# Patient Record
Sex: Female | Born: 1980 | Race: White | Hispanic: No | Marital: Married | Smoking: Never smoker
Health system: Southern US, Community
[De-identification: ages and names within clinical notes are randomized; demographics above are authoritative.]

## PROBLEM LIST (undated history)

## (undated) DIAGNOSIS — F419 Anxiety disorder, unspecified: Secondary | ICD-10-CM

---

## 2009-09-08 ENCOUNTER — Inpatient Hospital Stay: Payer: Self-pay

## 2015-11-27 ENCOUNTER — Encounter: Payer: Self-pay | Admitting: Emergency Medicine

## 2015-11-27 ENCOUNTER — Emergency Department
Admission: EM | Admit: 2015-11-27 | Discharge: 2015-11-27 | Disposition: A | Discharge status: Home / Self Care | Payer: Medicaid Other | Attending: Emergency Medicine | Admitting: Emergency Medicine

## 2015-11-27 DIAGNOSIS — J0141 Acute recurrent pansinusitis: Secondary | ICD-10-CM | POA: Insufficient documentation

## 2015-11-27 DIAGNOSIS — R51 Headache: Secondary | ICD-10-CM | POA: Diagnosis present

## 2015-11-27 MED ORDER — AZITHROMYCIN 250 MG PO TABS: ORAL_TABLET | ORAL | Status: DC

## 2015-11-27 NOTE — ED Provider Notes (Signed)
[facility] Emergency Department Provider Note  ____________________________________________  Time seen: Approximately 10:00 AM  I have reviewed the triage vital signs and the nursing notes.   HISTORY  Chief Complaint Facial Pain   HPI Lynn Flynn is a 35 y.o. female is here with complaint of sinus pressure and nasal congestion per week. Patient states she's been taking over-the-counter medication without any relief. Patient has a history of sinus infections. She denies any fever or chills. She states she has a headache mostly behind each eye but no visual changes and no nausea or vomiting. She is unaware of any fever. She continues to have some nasal congestion and drainage for which she has taken over-the-counter medication. Currently she rates her pain is 3 out of 10.She has never been a smoker.  History reviewed. No pertinent past medical history.  There are no active problems to display for this patient.   History reviewed. No pertinent past surgical history.  Current Outpatient Rx  Name  Route  Sig  Dispense  Refill  . azithromycin (ZITHROMAX Z-PAK) 250 MG tablet      Take 2 tablets (500 mg) on  Day 1,  followed by 1 tablet (250 mg) once daily on Days 2 through 5.   6 each   0     Allergies Review of patient's allergies indicates no known allergies.  No family history on file.  Social History Social History  Substance Use Topics  . Smoking status: Never Smoker   . Smokeless tobacco: None  . Alcohol Use: No    Review of Systems Constitutional: No fever/chills Eyes: No visual changes. ENT: No sore throat. Positive sinus pressure. Positive nasal congestion Cardiovascular: Denies chest pain. Respiratory: Denies shortness of breath. Occasional cough Gastrointestinal:   No nausea, no vomiting.   Musculoskeletal: Negative for back pain. Skin: Negative for rash. Neurological: Negative for headaches, focal weakness or  numbness.  10-point ROS otherwise negative.  ____________________________________________   PHYSICAL EXAM:  VITAL SIGNS: ED Triage Vitals  Enc Vitals Group     BP 11/27/15 0837 143/77 mmHg     Pulse Rate 11/27/15 0837 97     Resp 11/27/15 0837 18     Temp 11/27/15 0837 98.2 F (36.8 C)     Temp Source 11/27/15 0837 Oral     SpO2 11/27/15 0837 100 %     Weight 11/27/15 0837 155 lb (70.308 kg)     Height 11/27/15 0837  (1.575 m)     Head Cir --      Peak Flow --      Pain Score 11/27/15 0900 3     Pain Loc --      Pain Edu? --      Excl. in GC? --     Constitutional: Alert and oriented. Well appearing and in no acute distress. Eyes: Conjunctivae are normal. PERRL. EOMI. Head: Atraumatic. Nose: Mild congestion/no rhinnorhea.  TMs are dull bilaterally. There is tenderness on percussion of the frontal and maxillary sinuses. Nasal mucosa is slightly reddish pink. Mouth/Throat: Mucous membranes are moist.  Oropharynx non-erythematous. Neck: No stridor.   Hematological/Lymphatic/Immunilogical: No cervical lymphadenopathy. Cardiovascular: Normal rate, regular rhythm. Grossly normal heart sounds.  Good peripheral circulation. Respiratory: Normal respiratory effort.  No retractions. Lungs CTAB. Musculoskeletal: No lower extremity tenderness nor edema.  No joint effusions. Neurologic:  Normal speech and language. No gross focal neurologic deficits are appreciated. No gait instability. Skin:  Skin is warm, dry and intact. No  rash noted. Psychiatric: Mood and affect are normal. Speech and behavior are normal.  ____________________________________________   LABS (all labs ordered are listed, but only abnormal results are displayed)  Labs Reviewed - No data to display   PROCEDURES  Procedure(s) performed: None  Critical Care performed: No  ____________________________________________   INITIAL IMPRESSION / ASSESSMENT AND PLAN / ED COURSE  Pertinent labs & imaging  results that were available during my care of the patient were reviewed by me and considered in my medical decision making (see chart for details).  She is given a Z-Pak to begin taking today. She can continue taking her over-the-counter medication for congestion and Tylenol as needed for headache. He'll follow up with her PCP if any continued problems. ____________________________________________   FINAL CLINICAL IMPRESSION(S) / ED DIAGNOSES  Final diagnoses:  Acute recurrent pansinusitis      Tommi Rumps, PA-C 11/27/15 1226  Emily Filbert, MD 11/27/15 [phone]

## 2015-11-27 NOTE — ED Notes (Signed)
States she developed some sinus pressure and nasal congestion couple of days ago

## 2015-11-27 NOTE — Discharge Instructions (Signed)
Sinusitis, Adult Sinusitis is redness, soreness, and puffiness (inflammation) of the air pockets in the bones of your face (sinuses). The redness, soreness, and puffiness can cause air and mucus to get trapped in your sinuses. This can allow germs to grow and cause an infection.  HOME CARE   Drink enough fluids to keep your pee (urine) clear or pale yellow.  Use a humidifier in your home.  Run a hot shower to create steam in the bathroom. Sit in the bathroom with the door closed. Breathe in the steam 3-4 times a day.  Put a warm, moist washcloth on your face 3-4 times a day, or as told by your doctor.  Use salt water sprays (saline sprays) to wet the thick fluid in your nose. This can help the sinuses drain.  Only take medicine as told by your doctor. GET HELP RIGHT AWAY IF:   Your pain gets worse.  You have very bad headaches.  You are sick to your stomach (nauseous).  You throw up (vomit).  You are very sleepy (drowsy) all the time.  Your face is puffy (swollen).  Your vision changes.  You have a stiff neck.  You have trouble breathing. MAKE SURE YOU:   Understand these instructions.  Will watch your condition.  Will get help right away if you are not doing well or get worse.   This information is not intended to replace advice given to you by your health care provider. Make sure you discuss any questions you have with your health care provider.   Document Released: 03/23/2008 Document Revised: 10/26/2014 Document Reviewed: 05/10/2012 Elsevier Interactive Patient Education 2016 [employer].    Follow-up with [facility] if any continued problems. Begin medication today. Increase fluids. Tylenol or ibuprofen as needed for sinus pain.

## 2016-07-22 ENCOUNTER — Encounter: Payer: Self-pay | Admitting: Emergency Medicine

## 2016-07-22 ENCOUNTER — Emergency Department
Admission: EM | Admit: 2016-07-22 | Discharge: 2016-07-22 | Disposition: A | Discharge status: Home / Self Care | Payer: Medicaid Other | Attending: Emergency Medicine | Admitting: Emergency Medicine

## 2016-07-22 DIAGNOSIS — J069 Acute upper respiratory infection, unspecified: Secondary | ICD-10-CM

## 2016-07-22 DIAGNOSIS — R05 Cough: Secondary | ICD-10-CM | POA: Diagnosis present

## 2016-07-22 MED ORDER — CHLORPHENIRAMINE MALEATE 4 MG PO TABS: 4.0000 mg | ORAL_TABLET | Freq: Two times a day (BID) | ORAL | 0 refills | Status: DC | PRN

## 2016-07-22 MED ORDER — GUAIFENESIN-CODEINE 100-10 MG/5ML PO SOLN: 10.0000 mL | ORAL | 0 refills | Status: DC | PRN

## 2016-07-22 MED ORDER — AZITHROMYCIN 250 MG PO TABS: ORAL_TABLET | ORAL | 0 refills | Status: DC

## 2016-07-22 NOTE — ED Provider Notes (Signed)
[facility] Emergency Department Provider Note   ____________________________________________   None    (approximate)  I have reviewed the triage vital signs and the nursing notes.   HISTORY  Chief Complaint Cough    HPI Lynn Flynn is a 35 y.o. female presents for a one-week history of cough congestion drainage and sinus pressure. Patient states that she's not been really running a fever at all. Has tried over-the-counter medications with no relief. Take only 12.   History reviewed. No pertinent past medical history.  There are no active problems to display for this patient.   History reviewed. No pertinent surgical history.  Prior to Admission medications   Medication Sig Start Date End Date Taking? Authorizing Provider  azithromycin (ZITHROMAX Z-PAK) 250 MG tablet Take 2 tablets (500 mg) on  Day 1,  followed by 1 tablet (250 mg) once daily on Days 2 through 5. 07/22/16   Evangeline Dakin, PA-C  chlorpheniramine (CHLOR-TRIMETON) 4 MG tablet Take 1 tablet (4 mg total) by mouth 2 (two) times daily as needed for allergies or rhinitis. 07/22/16   Charmayne Sheer Floreen Teegarden, PA-C  guaiFENesin-codeine 100-10 MG/5ML syrup Take 10 mLs by mouth every 4 (four) hours as needed for cough. 07/22/16   Evangeline Dakin, PA-C    Allergies Review of patient's allergies indicates no known allergies.  History reviewed. No pertinent family history.  Social History Social History  Substance Use Topics  . Smoking status: Never Smoker  . Smokeless tobacco: Not on file  . Alcohol use No    Review of Systems Constitutional: No fever/chills Eyes: No visual changes. [id number] sore throat secondary to drainage. Cardiovascular: Denies chest pain. Respiratory: Denies shortness of breath. Positive for cough productive yellow sputum Gastrointestinal: No abdominal pain.  No nausea, no vomiting.  No diarrhea.  No constipation. Genitourinary: Negative for  dysuria. Musculoskeletal: Negative for back pain. Skin: Negative for rash. Neurological: Negative for headaches, focal weakness or numbness.  10-point ROS otherwise negative.  ____________________________________________   PHYSICAL EXAM:  VITAL SIGNS: ED Triage Vitals [07/22/16 1104]  Enc Vitals Group     BP 119/84     Pulse Rate 93     Resp 18     Temp 99.7 F (37.6 C)     Temp Source Oral     SpO2 97 %     Weight 156 lb (70.8 kg)     Height 5\' 2"  (1.575 m)     Head Circumference      Peak Flow      Pain Score      Pain Loc      Pain Edu?      Excl. in GC?     Constitutional: Alert and oriented. Well appearing and in no acute distress. Eyes: Conjunctivae are normal. PERRL. EOMI. Head: Atraumatic. Nose: Positive congestion/rhinnorhea. Mouth/Throat: Mucous membranes are moist.  Oropharynx non-erythematous. Neck: No stridor.  Supple, full range of motion, no cervical tenderness. No cervical adenopathy noted. Cardiovascular: Normal rate, regular rhythm. Grossly normal heart sounds.  Good peripheral circulation. Respiratory: Normal respiratory effort.  No retractions. Lungs CTAB. Musculoskeletal: No lower extremity tenderness nor edema.  No joint effusions. Neurologic:  Normal speech and language. No gross focal neurologic deficits are appreciated. No gait instability. Skin:  Skin is warm, dry and intact. No rash noted. Psychiatric: Mood and affect are normal. Speech and behavior are normal.  ____________________________________________   LABS (all labs ordered are listed, but only abnormal results are displayed)  Labs Reviewed - No data to display ____________________________________________  EKG   ____________________________________________  RADIOLOGY   ____________________________________________   PROCEDURES  Procedure(s) performed: None  Procedures  Critical Care performed: No  ____________________________________________   INITIAL  IMPRESSION / ASSESSMENT AND PLAN / ED COURSE  Pertinent labs & imaging results that were available during my care of the patient were reviewed by me and considered in my medical decision making (see chart for details).  Acute upper respiratory infection. Rx given for Zithromax, Robitussin-AC, chlorpheniramine. Patient follow-up with her PCP or return to ER with any worsening symptomology. Encouraged to use Tylenol ibuprofen over-the-counter as needed for fever or body aches. Does no other emergency medical complaints at this time.  Clinical Course     ____________________________________________   FINAL CLINICAL IMPRESSION(S) / ED DIAGNOSES  Final diagnoses:  Acute upper respiratory infection      NEW MEDICATIONS STARTED DURING THIS VISIT:  Discharge Medication List as of 07/22/2016 11:30 AM    START taking these medications   Details  chlorpheniramine (CHLOR-TRIMETON) 4 MG tablet Take 1 tablet (4 mg total) by mouth 2 (two) times daily as needed for allergies or rhinitis., Starting Wed 07/22/2016, Print    guaiFENesin-codeine 100-10 MG/5ML syrup Take 10 mLs by mouth every 4 (four) hours as needed for cough., Starting Wed 07/22/2016, Print         Note:  This document was prepared using Dragon voice recognition software and may include unintentional dictation errors.   Evangeline Dakinharles M Claus Silvestro, PA-C 07/22/16 1300    Jene Everyobert Kinner, MD 07/22/16 [phone]

## 2016-07-22 NOTE — ED Triage Notes (Signed)
C/o green productive cough X 1 week. No respiratory distress.  Thinks may have had fever at home. Has had runny nose as well.  Took benadryl this morning. Denies chest pain but feels tight from coughing. Gets URI every year this time. Feels like has chest congestion.

## 2016-11-01 ENCOUNTER — Encounter: Payer: Self-pay | Admitting: Emergency Medicine

## 2016-11-01 ENCOUNTER — Emergency Department
Admission: EM | Admit: 2016-11-01 | Discharge: 2016-11-01 | Disposition: A | Discharge status: Home / Self Care | Payer: Medicaid Other | Attending: Emergency Medicine | Admitting: Emergency Medicine

## 2016-11-01 DIAGNOSIS — J069 Acute upper respiratory infection, unspecified: Secondary | ICD-10-CM | POA: Diagnosis not present

## 2016-11-01 DIAGNOSIS — R05 Cough: Secondary | ICD-10-CM | POA: Diagnosis present

## 2016-11-01 DIAGNOSIS — B9789 Other viral agents as the cause of diseases classified elsewhere: Secondary | ICD-10-CM

## 2016-11-01 MED ORDER — BENZONATATE 100 MG PO CAPS: 200.0000 mg | ORAL_CAPSULE | Freq: Three times a day (TID) | ORAL | 0 refills | Status: AC | PRN

## 2016-11-01 NOTE — ED Notes (Signed)
See triage note  States she developed nasal congestion and cough for the past  Couple of days   Had fever and chills last pm

## 2016-11-01 NOTE — ED Triage Notes (Signed)
Pt reports chest congestion and productive cough since yesterday.  Subjective fevers at home last night.  Sinus headache as well.  OTC meds used yesterday.

## 2016-11-01 NOTE — ED Provider Notes (Signed)
[facility] Emergency Department Provider Note  ____________________________________________   First MD Initiated Contact with Patient 11/01/16 1108     (approximate)  I have reviewed the triage vital signs and the nursing notes.   HISTORY  Chief Complaint Nasal Congestion and Cough    HPI Lynn Flynn is a 36 y.o. female is her complaint of nasal congestion and cough for the last several days. Onset was gradual. Patient states she did have a fever and chills last p.m. She denies any nausea, vomiting or diarrhea. She states that she has used over-the-counter medication since yesterday with some relief of her cough. She also complains of sinus headache as well. She rates her discomfort as a 5/10.   History reviewed. No pertinent past medical history.  There are no active problems to display for this patient.   History reviewed. No pertinent surgical history.  Prior to Admission medications   Medication Sig Start Date End Date Taking? Authorizing Provider  benzonatate (TESSALON PERLES) 100 MG capsule Take 2 capsules (200 mg total) by mouth 3 (three) times daily as needed. 11/01/16 11/01/17  Tommi Rumps, PA-C    Allergies Patient has no known allergies.  History reviewed. No pertinent family history.  Social History Social History  Substance Use Topics  . Smoking status: Never Smoker  . Smokeless tobacco: Never Used  . Alcohol use No    Review of Systems Constitutional: Positive fever/chills Eyes: No visual changes. ENT: Positive nasal congestion. Cardiovascular: Denies chest pain. Positive productive cough. Respiratory: Denies shortness of breath. Gastrointestinal: No abdominal pain.  No nausea, no vomiting.  No diarrhea.  Musculoskeletal: Negative for back pain. Skin: Negative for rash. Neurological: Negative for headaches, focal weakness or numbness.  10-point ROS otherwise  negative.  ____________________________________________   PHYSICAL EXAM:  VITAL SIGNS: ED Triage Vitals  Enc Vitals Group     BP 11/01/16 1045 (!) 137/95     Pulse Rate 11/01/16 1045 100     Resp 11/01/16 1045 18     Temp 11/01/16 1045 99.8 F (37.7 C)     Temp Source 11/01/16 1045 Oral     SpO2 11/01/16 1045 99 %     Weight 11/01/16 1046 156 lb (70.8 kg)     Height 11/01/16 1046 5\' 4"  (1.626 m)     Head Circumference --      Peak Flow --      Pain Score 11/01/16 1046 5     Pain Loc --      Pain Edu? --      Excl. in GC? --     Constitutional: Alert and oriented. Well appearing and in no acute distress.Patient does not appear to be toxic or acutely ill. Eyes: Conjunctivae are normal. PERRL. EOMI. Head: Atraumatic. Nose: Mild congestion/rhinnorhea.  TMs are dull without erythema or injection. Mouth/Throat: Mucous membranes are moist.  Oropharynx non-erythematous. Neck: No stridor.   Hematological/Lymphatic/Immunilogical: No cervical lymphadenopathy. Cardiovascular: Normal rate, regular rhythm. Grossly normal heart sounds.  Good peripheral circulation. Respiratory: Normal respiratory effort.  No retractions. Lungs CTAB. Gastrointestinal: Soft and nontender. No distention. No abdominal bruits. No CVA tenderness. Musculoskeletal: Moves the upper and lower extremities without difficulty. Normal gait was noted. Neurologic:  Normal speech and language. No gross focal neurologic deficits are appreciated. No gait instability. Skin:  Skin is warm, dry and intact. No rash noted. Psychiatric: Mood and affect are normal. Speech and behavior are normal.  ____________________________________________   LABS (all labs ordered are  listed, but only abnormal results are displayed)  Labs Reviewed - No data to display ____________________________________________  PROCEDURES  Procedure(s) performed: None  Procedures  Critical Care performed:  No  ____________________________________________   INITIAL IMPRESSION / ASSESSMENT AND PLAN / ED COURSE  Pertinent labs & imaging results that were available during my care of the patient were reviewed by me and considered in my medical decision making (see chart for details).    Clinical Course    Patient was encouraged to take Tylenol or ibuprofen as needed for headache, fever or chills. She is given a prescription for Tessalon perles 2 every 8 hours if needed for cough. She is also use saline nose spray as needed for mucus. She will continue her over-the-counter cold medication for nasal congestion. She'll follow up with her primary care doctor or can no clinic if any continued problems.  ____________________________________________   FINAL CLINICAL IMPRESSION(S) / ED DIAGNOSES  Final diagnoses:  Viral URI with cough      NEW MEDICATIONS STARTED DURING THIS VISIT:  Discharge Medication List as of 11/01/2016 11:36 AM    START taking these medications   Details  benzonatate (TESSALON PERLES) 100 MG capsule Take 2 capsules (200 mg total) by mouth 3 (three) times daily as needed., Starting Sun 11/01/2016, Until Mon 11/01/2017, Print         Note:  This document was prepared using Dragon voice recognition software and may include unintentional dictation errors.    Tommi RumpsRhonda L Elliyah Liszewski, PA-C 11/01/16 1630    Governor Rooksebecca Lord, MD 11/02/16 0730

## 2016-11-01 NOTE — Discharge Instructions (Signed)
Continue taking your over-the-counter cold medication for nasal congestion. You  can also use saline nose spray for mucus if needed. Begin taking Tessalon Perles 2 capsules every 8 hours as needed for cough. You will need to follow up with your primary care doctor or [facility] if any continued problems. You may take Tylenol or ibuprofen if needed for fever, aches, headache, or throat pain.  Increase fluids.

## 2016-11-10 ENCOUNTER — Encounter: Payer: Self-pay | Admitting: *Deleted

## 2016-11-10 ENCOUNTER — Emergency Department
Admission: EM | Admit: 2016-11-10 | Discharge: 2016-11-10 | Disposition: A | Discharge status: Home / Self Care | Payer: Medicaid Other | Attending: Emergency Medicine | Admitting: Emergency Medicine

## 2016-11-10 DIAGNOSIS — R1110 Vomiting, unspecified: Secondary | ICD-10-CM | POA: Diagnosis present

## 2016-11-10 DIAGNOSIS — K529 Noninfective gastroenteritis and colitis, unspecified: Secondary | ICD-10-CM

## 2016-11-10 LAB — CBC
HEMATOCRIT: 37.4 % (ref 35.0–47.0)
HEMOGLOBIN: 13.1 g/dL (ref 12.0–16.0)
MCH: 31.5 pg (ref 26.0–34.0)
MCHC: 35.1 g/dL (ref 32.0–36.0)
MCV: 89.8 fL (ref 80.0–100.0)
Platelets: 214 10*3/uL (ref 150–440)
RBC: 4.16 MIL/uL (ref 3.80–5.20)
RDW: 13.4 % (ref 11.5–14.5)
WBC: 8.6 10*3/uL (ref 3.6–11.0)

## 2016-11-10 LAB — URINALYSIS, COMPLETE (UACMP) WITH MICROSCOPIC
Bilirubin Urine: NEGATIVE
GLUCOSE, UA: NEGATIVE mg/dL
Hgb urine dipstick: NEGATIVE
KETONES UR: 20 mg/dL — AB
Nitrite: NEGATIVE
PROTEIN: 30 mg/dL — AB
Specific Gravity, Urine: 1.026 (ref 1.005–1.030)
pH: 5 (ref 5.0–8.0)

## 2016-11-10 LAB — COMPREHENSIVE METABOLIC PANEL
ALBUMIN: 4.2 g/dL (ref 3.5–5.0)
ALT: 28 U/L (ref 14–54)
AST: 18 U/L (ref 15–41)
Alkaline Phosphatase: 48 U/L (ref 38–126)
Anion gap: 9 (ref 5–15)
BUN: 11 mg/dL (ref 6–20)
CHLORIDE: 106 mmol/L (ref 101–111)
CO2: 21 mmol/L — AB (ref 22–32)
Calcium: 9.3 mg/dL (ref 8.9–10.3)
Creatinine, Ser: 0.66 mg/dL (ref 0.44–1.00)
GFR calc Af Amer: >60 mL/min (ref >60)
GFR calc non Af Amer: >60 mL/min (ref >60)
GLUCOSE: 119 mg/dL — AB (ref 65–99)
POTASSIUM: 4.2 mmol/L (ref 3.5–5.1)
SODIUM: 136 mmol/L (ref 135–145)
Total Bilirubin: 0.7 mg/dL (ref 0.3–1.2)
Total Protein: 7.8 g/dL (ref 6.5–8.1)

## 2016-11-10 LAB — LIPASE, BLOOD: LIPASE: 15 U/L (ref 11–51)

## 2016-11-10 MED ORDER — ONDANSETRON HCL 4 MG PO TABS: 4.0000 mg | ORAL_TABLET | Freq: Three times a day (TID) | ORAL | 0 refills | Status: DC | PRN

## 2016-11-10 MED ORDER — DICYCLOMINE HCL 20 MG PO TABS: 20.0000 mg | ORAL_TABLET | Freq: Three times a day (TID) | ORAL | 0 refills | Status: DC | PRN

## 2016-11-10 MED ORDER — DICYCLOMINE HCL 10 MG PO CAPS
ORAL_CAPSULE | ORAL | Status: AC
  Administered 2016-11-10: 10 mg via ORAL
  Filled 2016-11-10: qty 1

## 2016-11-10 MED ORDER — DICYCLOMINE HCL 10 MG PO CAPS
10.0000 mg | ORAL_CAPSULE | Freq: Once | ORAL | Status: AC
  Administered 2016-11-10: 10 mg via ORAL

## 2016-11-10 MED ORDER — SODIUM CHLORIDE 0.9 % IV BOLUS (SEPSIS)
1000.0000 mL | Freq: Once | INTRAVENOUS | Status: AC
  Administered 2016-11-10: 1000 mL via INTRAVENOUS

## 2016-11-10 MED ORDER — ONDANSETRON HCL 4 MG/2ML IJ SOLN
4.0000 mg | Freq: Once | INTRAMUSCULAR | Status: AC
  Administered 2016-11-10: 4 mg via INTRAVENOUS
  Filled 2016-11-10: qty 2

## 2016-11-10 NOTE — ED Notes (Signed)
Dr. Goodman at bedside.  

## 2016-11-10 NOTE — ED Triage Notes (Signed)
Pt reports abd pain that began last night, states several episodes of diarrhea and 2 episodes of vomiting, at present pt awake and alert in no acute distress

## 2016-11-10 NOTE — ED Provider Notes (Signed)
[facility] Emergency Department Provider Note    ____________________________________________   I have reviewed the triage vital signs and the nursing notes.   HISTORY  Chief Complaint Diarrhea and Emesis   History limited by: Not Limited   HPI Lynn Flynn is a 36 y.o. female who presents to the emergency department today because of concerns for abdominal cramping, diarrhea and vomiting. Patient states that her stomach bubble. Down last night however this morning she started with diarrhea. She has had multiple nonbloody episodes. This has been accompanied by abdominal cramping which is located in the center of her stomach. She has had some episodes of vomiting also nonbloody. As any unusual ingestions, denies any untreated water or undercooked meats. The patient denies any known sick contacts with similar symptoms however states many members of her family have been sick with viral-type illnesses recently.   History reviewed. No pertinent past medical history.  There are no active problems to display for this patient.   History reviewed. No pertinent surgical history.  Prior to Admission medications   Medication Sig Start Date End Date Taking? Authorizing Provider  benzonatate (TESSALON PERLES) 100 MG capsule Take 2 capsules (200 mg total) by mouth 3 (three) times daily as needed. 11/01/16 11/01/17  Tommi Rumpshonda L Summers, PA-C    Allergies Patient has no known allergies.  History reviewed. No pertinent family history.  Social History Social History  Substance Use Topics  . Smoking status: Never Smoker  . Smokeless tobacco: Never Used  . Alcohol use No    Review of Systems  Constitutional: Negative for fever. Cardiovascular: Negative for chest pain. Respiratory: Negative for shortness of breath. Gastrointestinal: Positive for abdominal cramping, vomiting and diarrhea. Genitourinary: Negative for dysuria. Musculoskeletal: Negative for back  pain. Skin: Negative for rash. Neurological: Negative for headaches, focal weakness or numbness.  10-point ROS otherwise negative.  ____________________________________________   PHYSICAL EXAM:  VITAL SIGNS: ED Triage Vitals  Enc Vitals Group     BP 11/10/16 0919 120/76     Pulse Rate 11/10/16 0919 90     Resp 11/10/16 0919 18     Temp 11/10/16 0919 98.1 F (36.7 C)     Temp Source 11/10/16 0919 Oral     SpO2 11/10/16 0919 98 %     Weight 11/10/16 0920 156 lb (70.8 kg)     Height 11/10/16 0920 5\' 4"  (1.626 m)     Head Circumference --      Peak Flow --      Pain Score 11/10/16 0920 6   Constitutional: Alert and oriented. Well appearing and in no distress. Eyes: Conjunctivae are normal. Normal extraocular movements. ENT   Head: Normocephalic and atraumatic.   Nose: No congestion/rhinnorhea.   Mouth/Throat: Mucous membranes are moist.   Neck: No stridor. Hematological/Lymphatic/Immunilogical: No cervical lymphadenopathy. Cardiovascular: Normal rate, regular rhythm.  No murmurs, rubs, or gallops. Respiratory: Normal respiratory effort without tachypnea nor retractions. Breath sounds are clear and equal bilaterally. No wheezes/rales/rhonchi. Gastrointestinal: Soft and non tender. No rebound. No guarding.  Genitourinary: Deferred Musculoskeletal: Normal range of motion in all extremities. No lower extremity edema. Neurologic:  Normal speech and language. No gross focal neurologic deficits are appreciated.  Skin:  Skin is warm, dry and intact. No rash noted. Psychiatric: Mood and affect are normal. Speech and behavior are normal. Patient exhibits appropriate insight and judgment.  ____________________________________________    LABS (pertinent positives/negatives)  Labs Reviewed  COMPREHENSIVE METABOLIC PANEL - Abnormal; Notable for the following:  Result Value   CO2 21 (*)    Glucose, Bld 119 (*)    All other components within normal limits   URINALYSIS, COMPLETE (UACMP) WITH MICROSCOPIC - Abnormal; Notable for the following:    Color, Urine YELLOW (*)    APPearance CLEAR (*)    Ketones, ur 20 (*)    Protein, ur 30 (*)    Leukocytes, UA TRACE (*)    Bacteria, UA RARE (*)    Squamous Epithelial / LPF 0-5 (*)    All other components within normal limits  LIPASE, BLOOD  CBC     ____________________________________________   EKG  None  ____________________________________________    RADIOLOGY  None  ____________________________________________   PROCEDURES  Procedures  ____________________________________________   INITIAL IMPRESSION / ASSESSMENT AND PLAN / ED COURSE  Pertinent labs & imaging results that were available during my care of the patient were reviewed by me and considered in my medical decision making (see chart for details).  Patient presented to the emergency department today with concerns for nausea vomiting and abdominal cramping. Physical exam shows no significant abdominal tenderness. Lab work without any concerning findings. At this point I think gastroenteritis likely. I doubt abdominal infection such as appendicitis or cholecystitis. Patient was given IV fluids, Zofran and Bentyl here in the emergency department. She did feel better. Will discharge home with the same.  ____________________________________________   FINAL CLINICAL IMPRESSION(S) / ED DIAGNOSES  Final diagnoses:  Gastroenteritis     Note: This dictation was prepared with Dragon dictation. Any transcriptional errors that result from this process are unintentional     Phineas Semen, MD 11/10/16 1245

## 2016-11-10 NOTE — Discharge Instructions (Signed)
Please seek medical attention for any high fevers, chest pain, shortness of breath, change in behavior, persistent vomiting, bloody stool or any other new or concerning symptoms.  

## 2016-11-10 NOTE — ED Notes (Signed)
Pt given something to drink.  Pt holding liquid down states stomach cramping though.  MD aware.  Continue monitoring.

## 2016-11-10 NOTE — ED Notes (Signed)
NAD noted at time of D/C. Pt denies questions or concerns. Pt ambulatory to the lobby at this time.  

## 2019-04-15 ENCOUNTER — Other Ambulatory Visit: Payer: Self-pay | Admitting: Family Medicine

## 2019-04-15 DIAGNOSIS — Z20822 Contact with and (suspected) exposure to covid-19: Secondary | ICD-10-CM

## 2019-04-21 ENCOUNTER — Emergency Department: Payer: Self-pay

## 2019-04-21 ENCOUNTER — Other Ambulatory Visit: Payer: Self-pay

## 2019-04-21 ENCOUNTER — Ambulatory Visit: Payer: Self-pay | Admitting: *Deleted

## 2019-04-21 ENCOUNTER — Encounter: Payer: Self-pay | Admitting: Emergency Medicine

## 2019-04-21 ENCOUNTER — Emergency Department
Admission: EM | Admit: 2019-04-21 | Discharge: 2019-04-21 | Disposition: A | Discharge status: Home / Self Care | Payer: Self-pay | Attending: Emergency Medicine | Admitting: Emergency Medicine

## 2019-04-21 DIAGNOSIS — I493 Ventricular premature depolarization: Secondary | ICD-10-CM | POA: Insufficient documentation

## 2019-04-21 DIAGNOSIS — Z20828 Contact with and (suspected) exposure to other viral communicable diseases: Secondary | ICD-10-CM | POA: Insufficient documentation

## 2019-04-21 DIAGNOSIS — R0789 Other chest pain: Secondary | ICD-10-CM | POA: Insufficient documentation

## 2019-04-21 HISTORY — DX: Anxiety disorder, unspecified: F41.9

## 2019-04-21 LAB — SARS CORONAVIRUS 2 BY RT PCR (HOSPITAL ORDER, PERFORMED IN ~~LOC~~ HOSPITAL LAB): SARS Coronavirus 2: NEGATIVE

## 2019-04-21 LAB — CBC WITH DIFFERENTIAL/PLATELET
Abs Immature Granulocytes: 0.02 10*3/uL (ref 0.00–0.07)
Basophils Absolute: 0 10*3/uL (ref 0.0–0.1)
Basophils Relative: 0 %
Eosinophils Absolute: 0.1 10*3/uL (ref 0.0–0.5)
Eosinophils Relative: 1 %
HCT: 38.7 % (ref 36.0–46.0)
Hemoglobin: 12.9 g/dL (ref 12.0–15.0)
Immature Granulocytes: 0 %
Lymphocytes Relative: 37 %
Lymphs Abs: 3.1 10*3/uL (ref 0.7–4.0)
MCH: 30.6 pg (ref 26.0–34.0)
MCHC: 33.3 g/dL (ref 30.0–36.0)
MCV: 91.9 fL (ref 80.0–100.0)
Monocytes Absolute: 0.6 10*3/uL (ref 0.1–1.0)
Monocytes Relative: 7 %
Neutro Abs: 4.7 10*3/uL (ref 1.7–7.7)
Neutrophils Relative %: 55 %
Platelets: 223 10*3/uL (ref 150–400)
RBC: 4.21 MIL/uL (ref 3.87–5.11)
RDW: 13.1 % (ref 11.5–15.5)
WBC: 8.5 10*3/uL (ref 4.0–10.5)
nRBC: 0 % (ref 0.0–0.2)

## 2019-04-21 LAB — POCT PREGNANCY, URINE: Preg Test, Ur: NEGATIVE

## 2019-04-21 LAB — BASIC METABOLIC PANEL
Anion gap: 8 (ref 5–15)
BUN: 8 mg/dL (ref 6–20)
CO2: 21 mmol/L — ABNORMAL LOW (ref 22–32)
Calcium: 9.1 mg/dL (ref 8.9–10.3)
Chloride: 108 mmol/L (ref 98–111)
Creatinine, Ser: 0.69 mg/dL (ref 0.44–1.00)
GFR calc Af Amer: >60 mL/min (ref >60)
GFR calc non Af Amer: >60 mL/min (ref >60)
Glucose, Bld: 99 mg/dL (ref 70–99)
Potassium: 3.6 mmol/L (ref 3.5–5.1)
Sodium: 137 mmol/L (ref 135–145)

## 2019-04-21 LAB — FIBRIN DERIVATIVES D-DIMER (ARMC ONLY): Fibrin derivatives D-dimer (ARMC): 586.34 ng/mL (FEU) — ABNORMAL HIGH (ref 0.00–499.00)

## 2019-04-21 LAB — TROPONIN I (HIGH SENSITIVITY): Troponin I (High Sensitivity): 3 ng/L (ref <18)

## 2019-04-21 MED ORDER — IOHEXOL 350 MG/ML SOLN
75.0000 mL | Freq: Once | INTRAVENOUS | Status: AC | PRN
  Administered 2019-04-21: 75 mL via INTRAVENOUS

## 2019-04-21 MED ORDER — IPRATROPIUM-ALBUTEROL 0.5-2.5 (3) MG/3ML IN SOLN
3.0000 mL | Freq: Once | RESPIRATORY_TRACT | Status: AC
  Administered 2019-04-21: 3 mL via RESPIRATORY_TRACT
  Filled 2019-04-21: qty 3

## 2019-04-21 NOTE — ED Triage Notes (Signed)
Pt to ED via POV c/o shortness of breath and chest pressure. Pt states that she also had upset stomach a few days ago but that has went away now. Pt has been tested for COVID but is still waiting on the results. Pt denies fever. Pt is in NAD at this time, speaking in complete sentences, no accessory muscle use noted.

## 2019-04-21 NOTE — Telephone Encounter (Signed)
Summary: waiting on covid test results/seeking advice    Patient is waiting on her COVID test she got done 6/27. Patient just started getting symptoms on wednesday. Thursday she did have stomach pain. Patient woke up this morning felt like bricks were on her chest, but feels better as the day went on.  Patient is seeking advice.  Patient call back # [phone]      Patient has had a lot of stomach pain. Patient tried to go to work Thursday- but had to leave. Patient has had tightness in her chest with pressure. Patient states her O2 level was normal- she has been checking- 97. Coughing is causing pain.   Reason for Disposition . [1] MODERATE difficulty breathing (e.g., speaks in phrases, SOB even at rest, pulse 100-120) AND [2] NEW-onset or WORSE than normal  Answer Assessment - Initial Assessment Questions 1. RESPIRATORY STATUS: "Describe your breathing?" (e.g., wheezing, shortness of breath, unable to speak, severe coughing)      Patient states her breathing is not normal she feels pressure when she breaths in 2. ONSET: "When did this breathing problem begin?"      Pressure got worse at 2:30 this morning 3. PATTERN "Does the difficult breathing come and go, or has it been constant since it started?"      Comes and goes 4. SEVERITY: "How bad is your breathing?" (e.g., mild, moderate, severe)    - MILD: No SOB at rest, mild SOB with walking, speaks normally in sentences, can lay down, no retractions, pulse < 100.    - MODERATE: SOB at rest, SOB with minimal exertion and prefers to sit, cannot lie down flat, speaks in phrases, mild retractions, audible wheezing, pulse 100-120.    - SEVERE: Very SOB at rest, speaks in single words, struggling to breathe, sitting hunched forward, retractions, pulse > 120      Mild/moderate 5. RECURRENT SYMPTOM: "Have you had difficulty breathing before?" If so, ask: "When was the last time?" and "What happened that time?"      no 6. CARDIAC HISTORY: "Do you  have any history of heart disease?" (e.g., heart attack, angina, bypass surgery, angioplasty)      no 7. LUNG HISTORY: "Do you have any history of lung disease?"  (e.g., pulmonary embolus, asthma, emphysema)     no 8. CAUSE: "What do you think is causing the breathing problem?"      Possible COVID 9. OTHER SYMPTOMS: "Do you have any other symptoms? (e.g., dizziness, runny nose, cough, chest pain, fever)     Slight cough, slight dizziness 10. PREGNANCY: "Is there any chance you are pregnant?" "When was your last menstrual period?"       No- cycle started today 11. TRAVEL: "Have you traveled out of the country in the last month?" (e.g., travel history, exposures)       Travel to beach 2 weeks ago  Protocols used: BREATHING DIFFICULTY-A-AH

## 2019-04-21 NOTE — ED Provider Notes (Signed)
[facility] Emergency Department Provider Note   ____________________________________________    I have reviewed the triage vital signs and the nursing notes.   HISTORY  Chief Complaint Shortness of Breath     HPI Lynn Flynn is a 38 y.o. female who presents with complaints of shortness of breath and chest pressure.  Patient reports she is felt "a little off" over the last several days.  This morning around 230 she woke up with significant chest pressure in the center of her chest and a feeling of shortness of breath.  Her symptoms have improved however she continues to have mild chest pressure and shortness of breath especially with exertion.  Denies calf pain or swelling.  No recent travel.  No pleurisy.  No fevers or chills or myalgias.  No exposures to COVID positive patient.  No diaphoresis.  No history of the same.  Does not smoke, not on OCPs  Past Medical History:  Diagnosis Date  . Anxiety     There are no active problems to display for this patient.   History reviewed. No pertinent surgical history.  Prior to Admission medications   Not on File     Allergies Patient has no known allergies.  No family history on file.  Social History Social History   Tobacco Use  . Smoking status: Never Smoker  . Smokeless tobacco: Never Used  Substance Use Topics  . Alcohol use: Yes    Comment: social  . Drug use: Not Currently    Review of Systems  Constitutional: No fever/chills Eyes: No visual changes.  ENT: No sore throat. Cardiovascular: As above Respiratory: As above Gastrointestinal: No abdominal pain.  No nausea, no vomiting.   Genitourinary: Negative for dysuria. Musculoskeletal: Negative for back pain. Skin: Negative for rash. Neurological: Negative for headaches or weakness   ____________________________________________   PHYSICAL EXAM:  VITAL SIGNS: ED Triage Vitals  Enc Vitals Group     BP 04/21/19 1653  127/90     Pulse Rate 04/21/19 1653 83     Resp 04/21/19 1653 16     Temp 04/21/19 1653 99 F (37.2 C)     Temp Source 04/21/19 1653 Oral     SpO2 04/21/19 1653 99 %     Weight 04/21/19 1649 77.1 kg (170 lb)     Height 04/21/19 1649 1.575 m (5\' 2" )     Head Circumference --      Peak Flow --      Pain Score 04/21/19 1649 0     Pain Loc --      Pain Edu? --      Excl. in GC? --     Constitutional: Alert and oriented. No acute distress. Pleasant and interactive  Nose: No congestion/rhinnorhea. Mouth/Throat: Mucous membranes are moist.   Neck:  Painless ROM Cardiovascular: Normal rate, regular rhythm. Grossly normal heart sounds.  Good peripheral circulation. Respiratory: Normal respiratory effort.  No retractions. Lungs CTAB. Gastrointestinal: Soft and nontender. No distention.  No CVA tenderness. Genitourinary: deferred Musculoskeletal: No lower extremity tenderness nor edema.  Warm and well perfused Neurologic:  Normal speech and language. No gross focal neurologic deficits are appreciated.  Skin:  Skin is warm, dry and intact. No rash noted. Psychiatric: Mood and affect are normal. Speech and behavior are normal.  ____________________________________________   LABS (all labs ordered are listed, but only abnormal results are displayed)  Labs Reviewed  BASIC METABOLIC PANEL - Abnormal; Notable for the following components:  Result Value   CO2 21 (*)    All other components within normal limits  FIBRIN DERIVATIVES D-DIMER ([facility] ONLY) - Abnormal; Notable for the following components:   Fibrin derivatives D-dimer ([facility]) 586.34 (*)    All other components within normal limits  SARS CORONAVIRUS 2 (HOSPITAL ORDER, [place] [facility])  CBC WITH DIFFERENTIAL/PLATELET  TROPONIN I (HIGH SENSITIVITY)  POCT PREGNANCY, URINE   ____________________________________________  EKG  ED ECG REPORT I, Lavonia Drafts, the attending physician, personally viewed and  interpreted this ECG.  Date: 04/21/2019  Rhythm: normal sinus rhythm QRS Axis: normal Intervals: normal ST/T Wave abnormalities: normal Narrative Interpretation: PVCs noted  ____________________________________________  RADIOLOGY  Chest x-ray unremarkable ____________________________________________   PROCEDURES  Procedure(s) performed: No  Procedures   Critical Care performed: No ____________________________________________   INITIAL IMPRESSION / ASSESSMENT AND PLAN / ED COURSE  Pertinent labs & imaging results that were available during my care of the patient were reviewed by me and considered in my medical decision making (see chart for details).  Patient with chest pressure and shortness of breath as described above.  Given her age ACS is highly unlikely, differential includes pneumonia, PE, arrhythmia, pericarditis.  Will check labs including d-dimer, troponin, obtain chest x-rays placed on the monitor and watch closely.  Lab work is overall reassuring, d-dimer mildly elevated, will send for CT angiography.  CT scan is negative for PE.  Coronavirus test is negative.  On reevaluation the patient reports that she is feeling significantly better.  We did discuss that she had PVCs on her EKG and although she is feeling better now given that she had chest pressure I strongly recommended that she would follow-up with cardiology.  Strict return precautions.  She agrees with the plan.    ____________________________________________   FINAL CLINICAL IMPRESSION(S) / ED DIAGNOSES  Final diagnoses:  Atypical chest pain  PVC's (premature ventricular contractions)        Note:  This document was prepared using Dragon voice recognition software and may include unintentional dictation errors.   Lavonia Drafts, MD 04/21/19 2010

## 2019-04-22 LAB — NOVEL CORONAVIRUS, NAA: SARS-CoV-2, NAA: NOT DETECTED

## 2019-09-08 ENCOUNTER — Other Ambulatory Visit: Payer: Self-pay

## 2019-09-08 DIAGNOSIS — Z20822 Contact with and (suspected) exposure to covid-19: Secondary | ICD-10-CM

## 2019-09-10 LAB — NOVEL CORONAVIRUS, NAA: SARS-CoV-2, NAA: NOT DETECTED

## 2020-11-19 IMAGING — CT CT ANGIOGRAPHY CHEST
2 of 6 series · 19 of 46 positions shown · IV contrast (APPLIED)
Comparison: Radiograph of same day.

CLINICAL DATA: Shortness of breath.

EXAM:
CT ANGIOGRAPHY CHEST WITH CONTRAST
TECHNIQUE: Multidetector CT imaging of the chest was performed using the
standard protocol during bolus administration of intravenous
contrast. Multiplanar CT image reconstructions and MIPs were
obtained to evaluate the vascular anatomy.
CONTRAST:  75mL OMNIPAQUE IOHEXOL 350 MG/ML SOLN

[Series 5: thins · axial · 0.57mm/px · z∈[-319,-91]mm · 16 of 250 slices shown]
[im 11/250  lung]
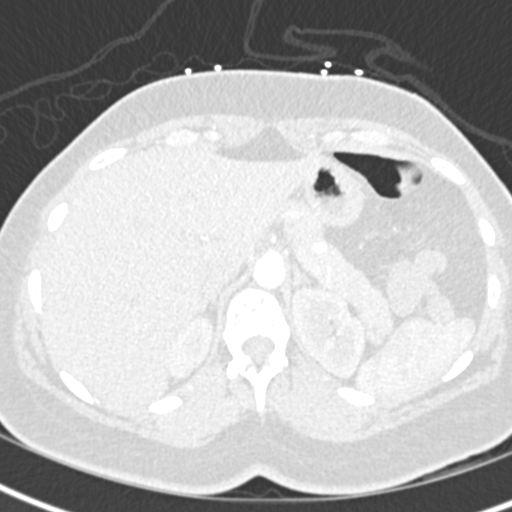
[im 33/250  soft-tissue]
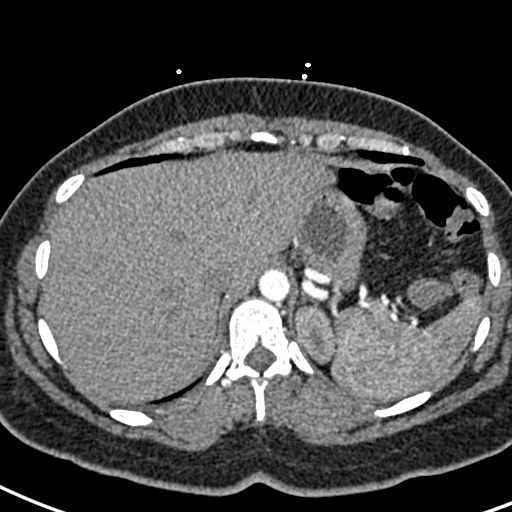
[im 44/250  lung]
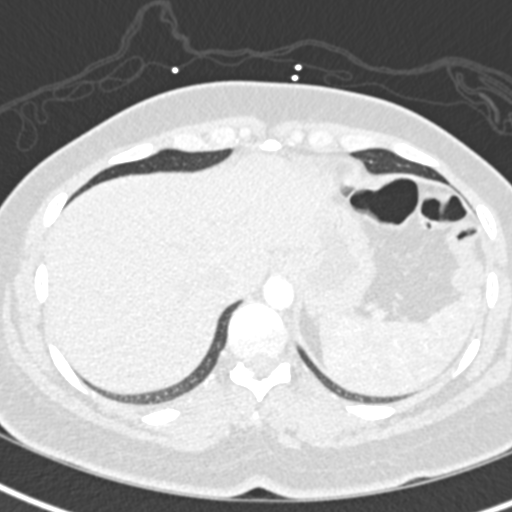
[im 55/250  soft-tissue]
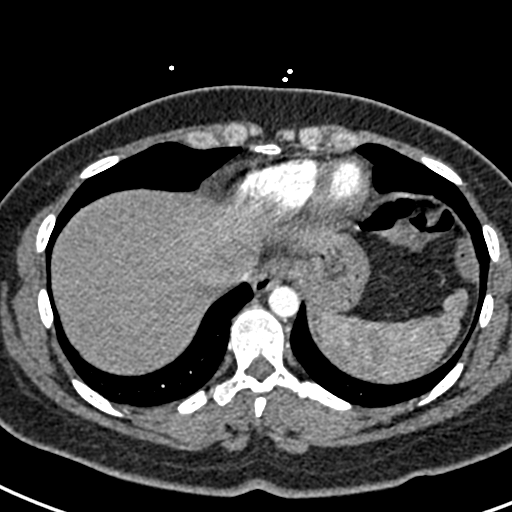
[im 76/250  lung]
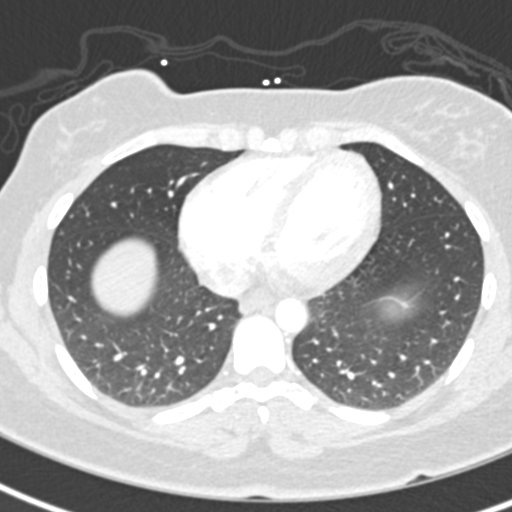
[im 87/250  soft-tissue]
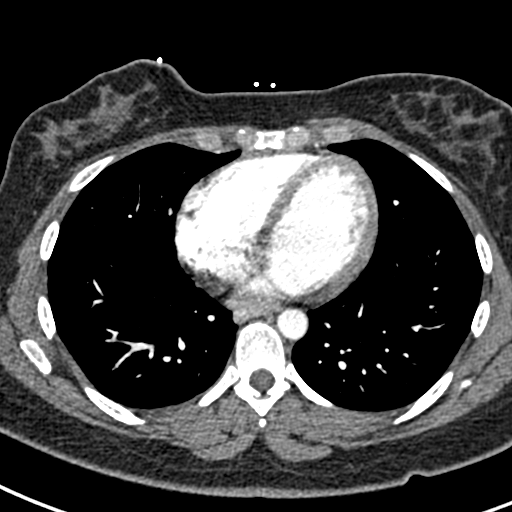
[im 98/250  lung]
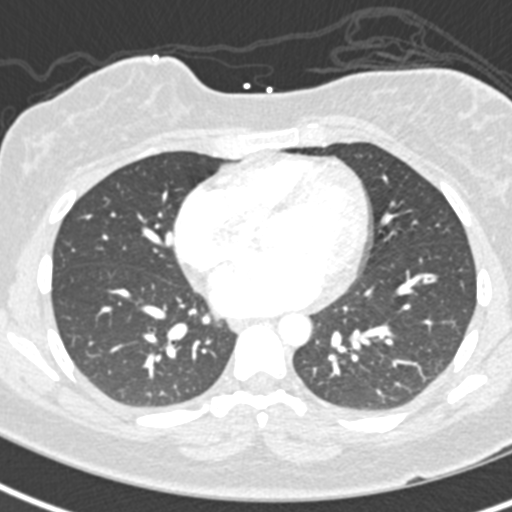
[im 120/250  soft-tissue]
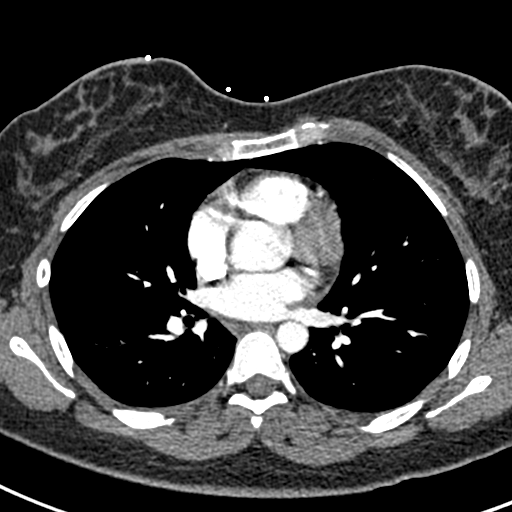
[im 130/250  lung]
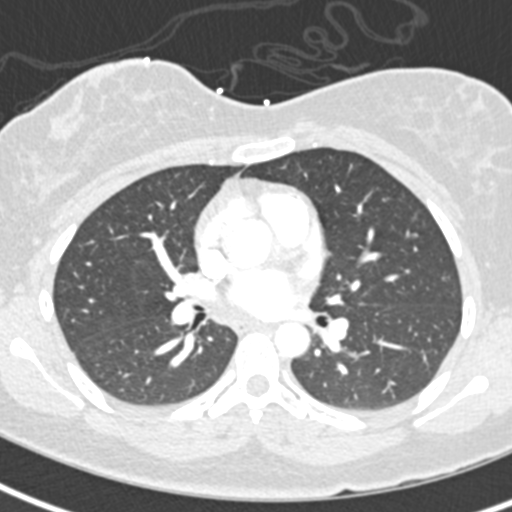
[im 152/250  soft-tissue]
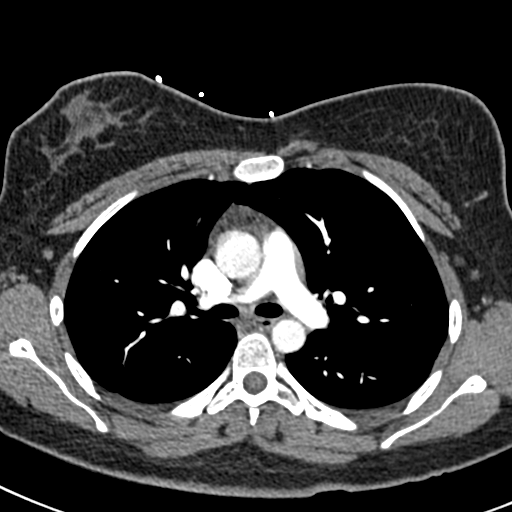
[im 163/250  lung]
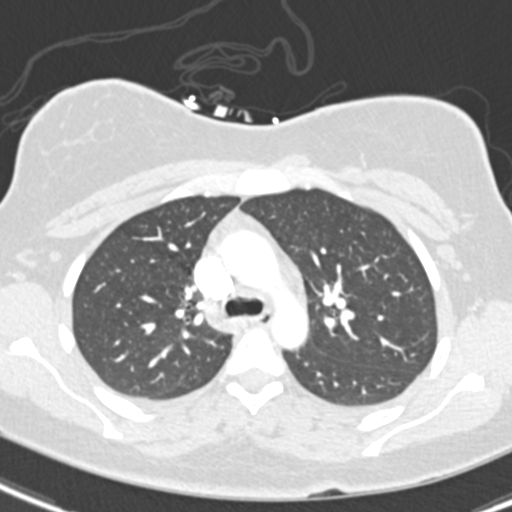
[im 174/250  soft-tissue]
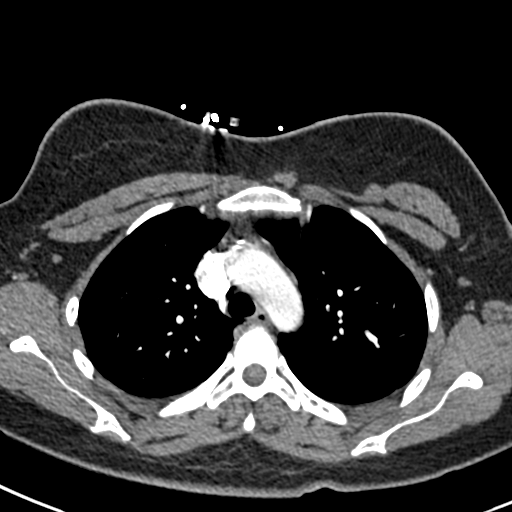
[im 195/250  lung]
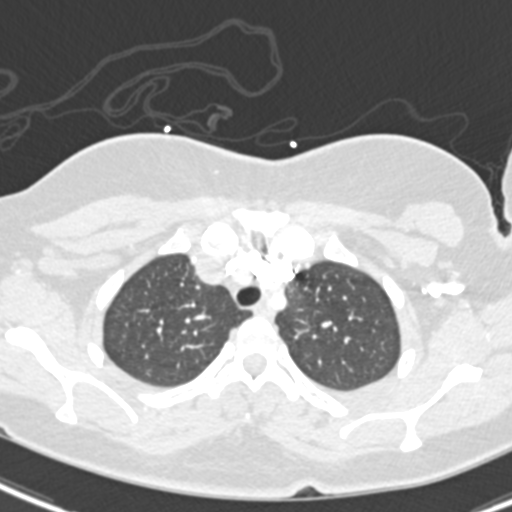
[im 206/250  soft-tissue]
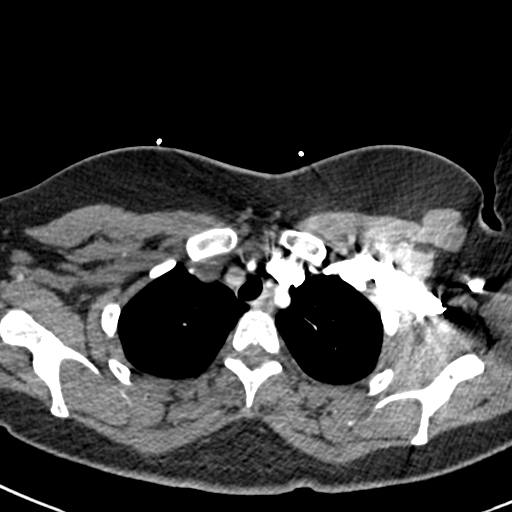
[im 217/250  lung]
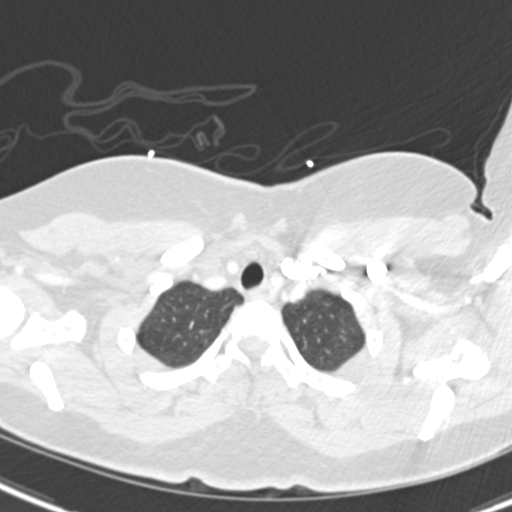
[im 239/250  soft-tissue]
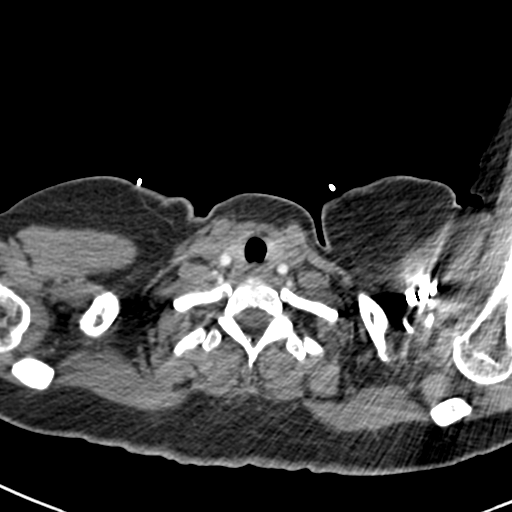

[Series 7: coronal mpr · coronal · 0.53mm/px · 3 of 77 slices shown]
[im 20/77  soft-tissue]
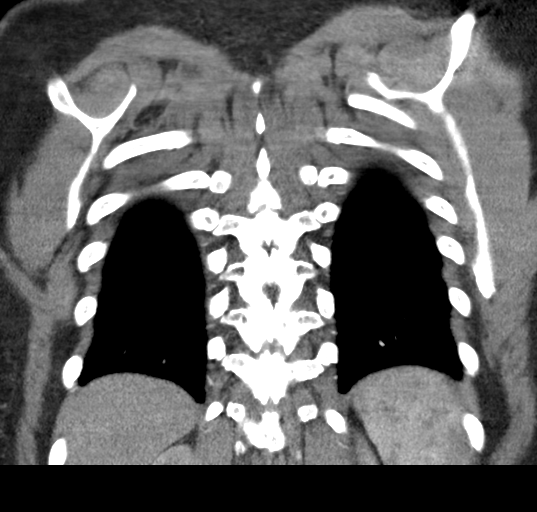
[im 39/77  soft-tissue]
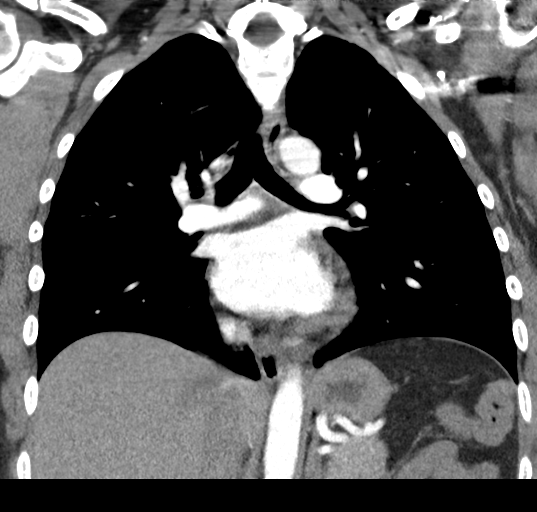
[im 58/77  soft-tissue]
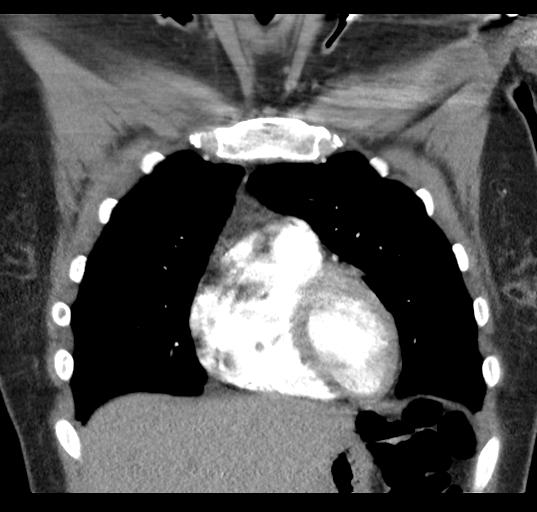

[19 of 46 positions shown; findings below may reference images not displayed]

FINDINGS: Cardiovascular: Satisfactory opacification of the pulmonary arteries
to the segmental level. No evidence of pulmonary embolism. Normal
heart size. No pericardial effusion.

Mediastinum/Nodes: No enlarged mediastinal, hilar, or axillary lymph
nodes. Thyroid gland, trachea, and esophagus demonstrate no
significant findings.

Lungs/Pleura: Lungs are clear. No pleural effusion or pneumothorax.

Upper Abdomen: No acute abnormality.

Musculoskeletal: No chest wall abnormality. No acute or significant
osseous findings.

Review of the MIP images confirms the above findings.
IMPRESSION: No evidence of pulmonary embolus.  No abnormality seen in the chest.

## 2020-11-19 IMAGING — DX PORTABLE CHEST - 1 VIEW
1 series · 1 of 1 positions shown · non-contrast
Comparison: None.

CLINICAL DATA: Short of breath and chest pressure for 2 days.
Nonsmoker.

EXAM:
PORTABLE CHEST 1 VIEW

[chest ap]
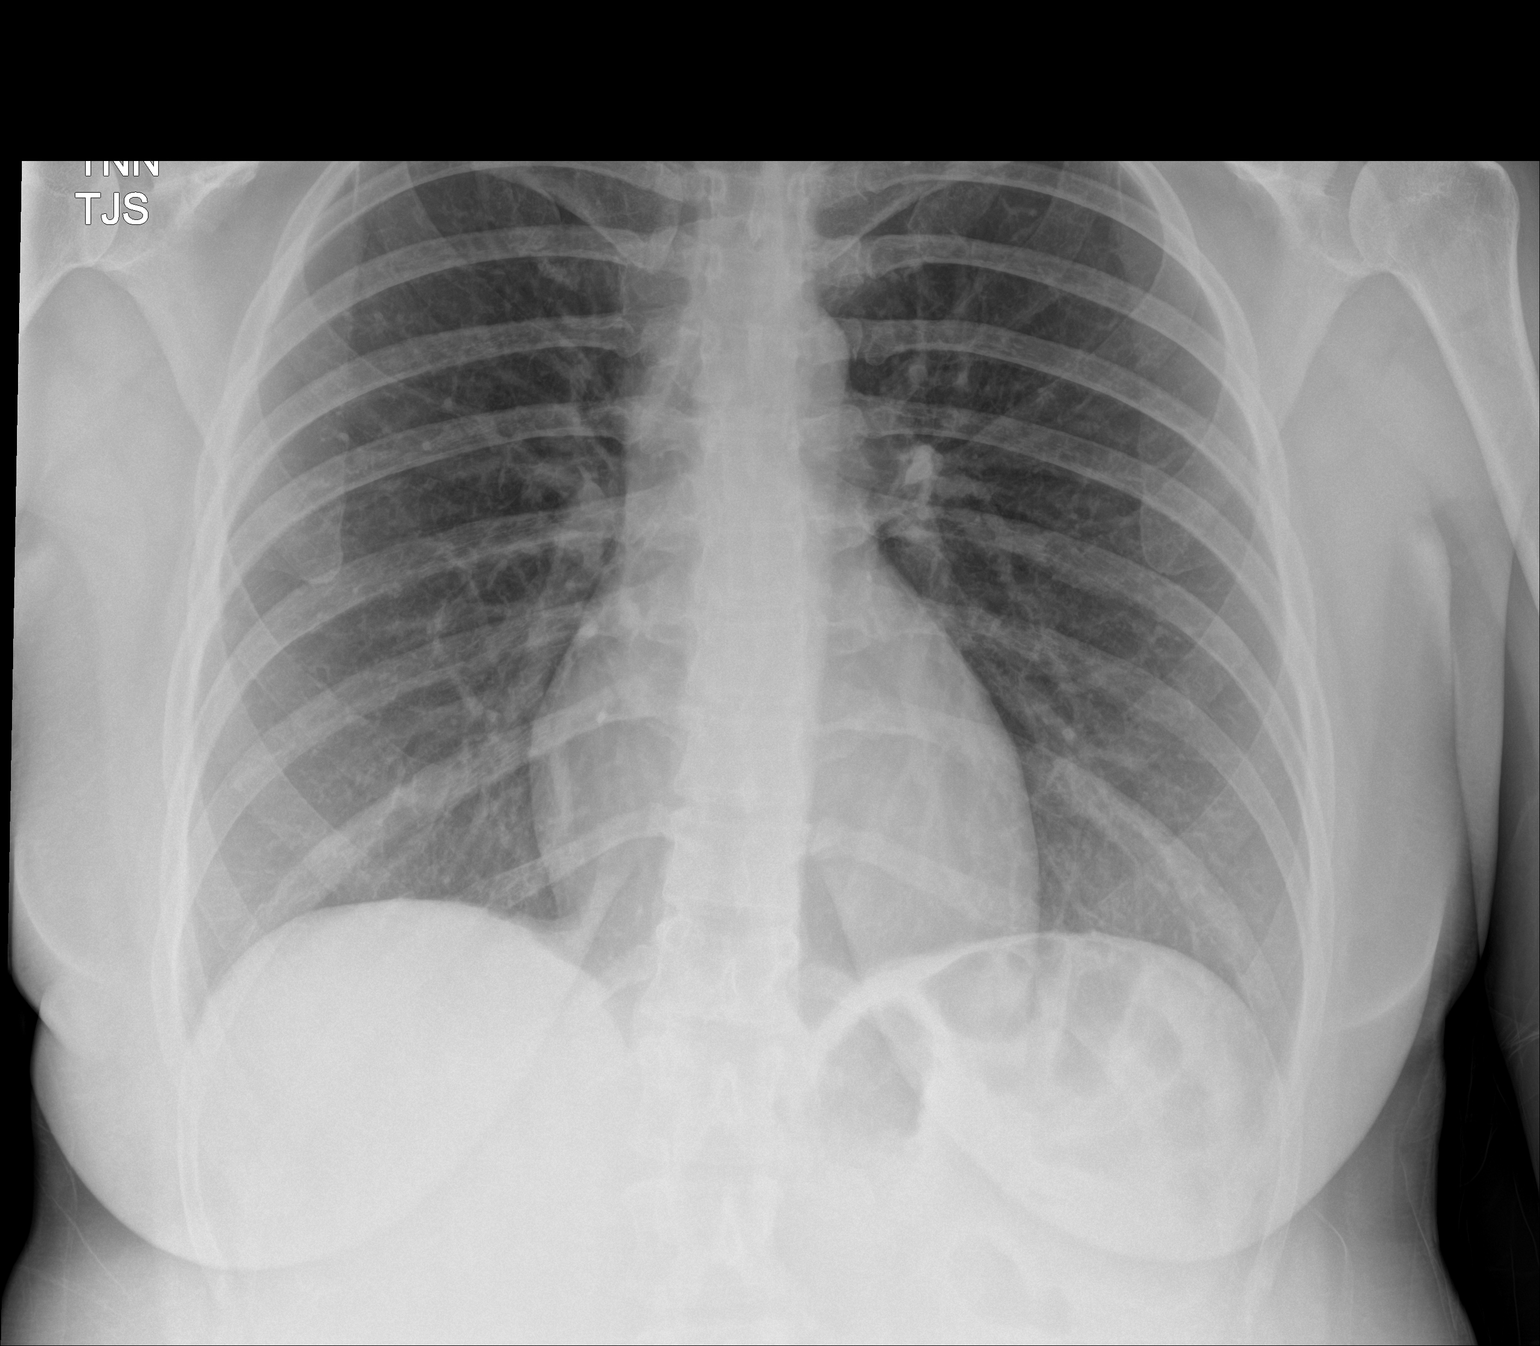

[1 of 1 positions shown; findings below may reference images not displayed]

FINDINGS: Midline trachea. Normal heart size and mediastinal contours. No
pleural effusion or pneumothorax. Clear lungs.
IMPRESSION: Normal chest.

## 2022-01-07 ENCOUNTER — Other Ambulatory Visit: Payer: Self-pay | Admitting: Nurse Practitioner

## 2022-12-01 ENCOUNTER — Other Ambulatory Visit: Payer: Commercial Managed Care - HMO

## 2022-12-01 DIAGNOSIS — R7302 Impaired glucose tolerance (oral): Secondary | ICD-10-CM

## 2022-12-01 DIAGNOSIS — E785 Hyperlipidemia, unspecified: Secondary | ICD-10-CM

## 2022-12-01 DIAGNOSIS — E669 Obesity, unspecified: Secondary | ICD-10-CM

## 2022-12-01 DIAGNOSIS — I10 Essential (primary) hypertension: Secondary | ICD-10-CM

## 2022-12-02 LAB — CMP14+EGFR
ALT: 25 IU/L (ref 0–32)
AST: 18 IU/L (ref 0–40)
Albumin/Globulin Ratio: 2 (ref 1.2–2.2)
Albumin: 4.7 g/dL (ref 3.9–4.9)
Alkaline Phosphatase: 99 IU/L (ref 44–121)
BUN/Creatinine Ratio: 13 (ref 9–23)
BUN: 9 mg/dL (ref 6–24)
Bilirubin Total: 0.3 mg/dL (ref 0.0–1.2)
CO2: 21 mmol/L (ref 20–29)
Calcium: 9.3 mg/dL (ref 8.7–10.2)
Chloride: 100 mmol/L (ref 96–106)
Creatinine, Ser: 0.71 mg/dL (ref 0.57–1.00)
Globulin, Total: 2.3 g/dL (ref 1.5–4.5)
Glucose: 79 mg/dL (ref 70–99)
Potassium: 3.9 mmol/L (ref 3.5–5.2)
Sodium: 137 mmol/L (ref 134–144)
Total Protein: 7 g/dL (ref 6.0–8.5)
eGFR: 110 mL/min/{1.73_m2} (ref >59)

## 2022-12-02 LAB — LIPID PANEL
Chol/HDL Ratio: 2.7 ratio (ref 0.0–4.4)
Cholesterol, Total: 163 mg/dL (ref 100–199)
HDL: 60 mg/dL (ref >39)
LDL Chol Calc (NIH): 84 mg/dL (ref 0–99)
Triglycerides: 108 mg/dL (ref 0–149)
VLDL Cholesterol Cal: 19 mg/dL (ref 5–40)

## 2022-12-02 LAB — CBC WITH DIFFERENTIAL
Basophils Absolute: 0 10*3/uL (ref 0.0–0.2)
Basos: 0 %
EOS (ABSOLUTE): 0.1 10*3/uL (ref 0.0–0.4)
Eos: 1 %
Hematocrit: 37.8 % (ref 34.0–46.6)
Hemoglobin: 12.5 g/dL (ref 11.1–15.9)
Immature Grans (Abs): 0 10*3/uL (ref 0.0–0.1)
Immature Granulocytes: 0 %
Lymphocytes Absolute: 2.5 10*3/uL (ref 0.7–3.1)
Lymphs: 39 %
MCH: 30.7 pg (ref 26.6–33.0)
MCHC: 33.1 g/dL (ref 31.5–35.7)
MCV: 93 fL (ref 79–97)
Monocytes Absolute: 0.4 10*3/uL (ref 0.1–0.9)
Monocytes: 6 %
Neutrophils Absolute: 3.4 10*3/uL (ref 1.4–7.0)
Neutrophils: 54 %
RBC: 4.07 x10E6/uL (ref 3.77–5.28)
RDW: 13.1 % (ref 11.7–15.4)
WBC: 6.4 10*3/uL (ref 3.4–10.8)

## 2022-12-02 LAB — HEMOGLOBIN A1C
Est. average glucose Bld gHb Est-mCnc: 114 mg/dL
Hgb A1c MFr Bld: 5.6 % (ref 4.8–5.6)

## 2022-12-03 ENCOUNTER — Ambulatory Visit: Payer: Commercial Managed Care - HMO | Admitting: Nurse Practitioner

## 2022-12-21 ENCOUNTER — Encounter: Payer: Self-pay | Admitting: Nurse Practitioner

## 2022-12-21 ENCOUNTER — Other Ambulatory Visit: Payer: Self-pay

## 2023-02-10 ENCOUNTER — Other Ambulatory Visit: Payer: Self-pay

## 2023-02-11 MED ORDER — PHENTERMINE HCL 37.5 MG PO TABS: 37.5000 mg | ORAL_TABLET | Freq: Every morning | ORAL | 0 refills | Status: DC

## 2023-02-13 ENCOUNTER — Other Ambulatory Visit: Payer: Self-pay | Admitting: Nurse Practitioner

## 2023-03-13 ENCOUNTER — Other Ambulatory Visit: Payer: Self-pay | Admitting: Nurse Practitioner

## 2023-03-14 ENCOUNTER — Other Ambulatory Visit: Payer: Self-pay | Admitting: Nurse Practitioner

## 2023-03-15 ENCOUNTER — Other Ambulatory Visit: Payer: Self-pay | Admitting: Nurse Practitioner

## 2023-03-15 MED ORDER — PHENTERMINE HCL 37.5 MG PO TABS: 37.5000 mg | ORAL_TABLET | Freq: Every morning | ORAL | 2 refills | Status: DC

## 2023-04-11 ENCOUNTER — Other Ambulatory Visit: Payer: Self-pay | Admitting: Nurse Practitioner

## 2023-05-14 ENCOUNTER — Other Ambulatory Visit: Payer: Self-pay | Admitting: Family

## 2023-06-18 ENCOUNTER — Other Ambulatory Visit: Payer: Self-pay | Admitting: Family

## 2023-06-26 ENCOUNTER — Other Ambulatory Visit: Payer: Self-pay | Admitting: Family

## 2023-07-26 ENCOUNTER — Other Ambulatory Visit: Payer: Self-pay | Admitting: Family

## 2023-07-27 ENCOUNTER — Other Ambulatory Visit: Payer: Self-pay | Admitting: Family

## 2023-08-11 ENCOUNTER — Other Ambulatory Visit: Payer: Self-pay | Admitting: Family

## 2023-08-21 ENCOUNTER — Other Ambulatory Visit: Payer: Self-pay | Admitting: Family

## 2023-09-08 ENCOUNTER — Other Ambulatory Visit: Payer: Self-pay | Admitting: Family

## 2023-09-19 ENCOUNTER — Other Ambulatory Visit: Payer: Self-pay | Admitting: Family

## 2023-10-10 ENCOUNTER — Other Ambulatory Visit: Payer: Self-pay | Admitting: Family

## 2023-10-23 ENCOUNTER — Other Ambulatory Visit: Payer: Self-pay | Admitting: Family

## 2023-11-16 ENCOUNTER — Other Ambulatory Visit: Payer: Self-pay | Admitting: Family

## 2023-11-25 ENCOUNTER — Other Ambulatory Visit: Payer: Self-pay | Admitting: Family

## 2023-11-25 ENCOUNTER — Other Ambulatory Visit: Payer: Self-pay

## 2023-12-25 ENCOUNTER — Other Ambulatory Visit: Payer: Self-pay | Admitting: Family

## 2023-12-30 ENCOUNTER — Other Ambulatory Visit: Payer: Self-pay

## 2023-12-30 DIAGNOSIS — R7301 Impaired fasting glucose: Secondary | ICD-10-CM

## 2023-12-30 DIAGNOSIS — E669 Obesity, unspecified: Secondary | ICD-10-CM

## 2023-12-30 DIAGNOSIS — I10 Essential (primary) hypertension: Secondary | ICD-10-CM

## 2024-01-05 ENCOUNTER — Other Ambulatory Visit

## 2024-01-06 ENCOUNTER — Ambulatory Visit: Admitting: Cardiology

## 2024-01-06 ENCOUNTER — Other Ambulatory Visit: Payer: Self-pay

## 2024-01-06 ENCOUNTER — Encounter: Payer: Self-pay | Admitting: Cardiology

## 2024-01-06 VITALS — BP 110/74 | HR 105 | Ht 62.0 in | Wt 180.0 lb

## 2024-01-06 DIAGNOSIS — E782 Mixed hyperlipidemia: Secondary | ICD-10-CM | POA: Insufficient documentation

## 2024-01-06 DIAGNOSIS — R519 Headache, unspecified: Secondary | ICD-10-CM | POA: Diagnosis not present

## 2024-01-06 DIAGNOSIS — Z0130 Encounter for examination of blood pressure without abnormal findings: Secondary | ICD-10-CM

## 2024-01-06 DIAGNOSIS — R7989 Other specified abnormal findings of blood chemistry: Secondary | ICD-10-CM | POA: Insufficient documentation

## 2024-01-06 DIAGNOSIS — Z713 Dietary counseling and surveillance: Secondary | ICD-10-CM | POA: Diagnosis not present

## 2024-01-06 LAB — LIPID PANEL
Chol/HDL Ratio: 3.5 ratio (ref 0.0–4.4)
Cholesterol, Total: 194 mg/dL (ref 100–199)
HDL: 56 mg/dL (ref >39)
LDL Chol Calc (NIH): 96 mg/dL (ref 0–99)
Triglycerides: 253 mg/dL — ABNORMAL HIGH (ref 0–149)
VLDL Cholesterol Cal: 42 mg/dL — ABNORMAL HIGH (ref 5–40)

## 2024-01-06 LAB — CMP14+EGFR
ALT: 19 IU/L (ref 0–32)
AST: 19 IU/L (ref 0–40)
Albumin: 4 g/dL (ref 3.9–4.9)
Alkaline Phosphatase: 88 IU/L (ref 44–121)
BUN/Creatinine Ratio: 14 (ref 9–23)
BUN: 10 mg/dL (ref 6–24)
Bilirubin Total: <0.2 mg/dL (ref 0.0–1.2)
CO2: 21 mmol/L (ref 20–29)
Calcium: 9 mg/dL (ref 8.7–10.2)
Chloride: 103 mmol/L (ref 96–106)
Creatinine, Ser: 0.74 mg/dL (ref 0.57–1.00)
Globulin, Total: 2.5 g/dL (ref 1.5–4.5)
Glucose: 88 mg/dL (ref 70–99)
Potassium: 4.2 mmol/L (ref 3.5–5.2)
Sodium: 139 mmol/L (ref 134–144)
Total Protein: 6.5 g/dL (ref 6.0–8.5)
eGFR: 104 mL/min/{1.73_m2} (ref >59)

## 2024-01-06 LAB — TSH: TSH: 6.72 u[IU]/mL — ABNORMAL HIGH (ref 0.450–4.500)

## 2024-01-06 LAB — HEMOGLOBIN A1C
Est. average glucose Bld gHb Est-mCnc: 111 mg/dL
Hgb A1c MFr Bld: 5.5 % (ref 4.8–5.6)

## 2024-01-06 MED ORDER — SUMATRIPTAN SUCCINATE 25 MG PO TABS: 50.0000 mg | ORAL_TABLET | Freq: Once | ORAL | 2 refills | Status: DC | PRN

## 2024-01-06 MED ORDER — WEGOVY 0.25 MG/0.5ML ~~LOC~~ SOAJ: 0.2500 mg | SUBCUTANEOUS | 3 refills | Status: DC

## 2024-01-06 MED ORDER — GABAPENTIN 100 MG PO CAPS: 100.0000 mg | ORAL_CAPSULE | Freq: Every day | ORAL | 2 refills | Status: DC

## 2024-01-06 NOTE — Progress Notes (Signed)
 Established Patient Office Visit  Subjective:  Patient ID: Lynn Flynn, female    DOB: [birth year]  Age: 43 y.o. MRN: [id number]  Chief Complaint  Patient presents with   Medication Refill    Medications and Lab Results    Patient in office for follow up, discuss recent lab results, refill medications.  Patient complains of feeling fatigued, weight gain, eating uncontrollably. States she has taken phentermine in the past, gained weight back after stopping phentermine. Restart phentermine. Will send in [place], insurance will likely not pay for it.  Complains of frequent headaches, up to 3 a week. Will send in Imitrex.  Patient is a Research scientist (medical), has carpal tunnel syndrome. Complaining of numbness and tingling, was on gabapentin previously which she states helped. Will restart Gabapentin.  Discussed recent lab results. TSH abnormal. Will recheck in 4 weeks, if continues to be low, will start levothyroxine.     No other concerns at this time.   Past Medical History:  Diagnosis Date   Anxiety     History reviewed. No pertinent surgical history.  Social History   Socioeconomic History   Marital status: Married    Spouse name: Not on file   Number of children: Not on file   Years of education: Not on file   Highest education level: Not on file  Occupational History   Not on file  Tobacco Use   Smoking status: Never   Smokeless tobacco: Never  Substance and Sexual Activity   Alcohol use: Yes    Comment: social   Drug use: Not Currently   Sexual activity: Not on file  Other Topics Concern   Not on file  Social History Narrative   Not on file   Social Drivers of Health   Financial Resource Strain: Not on file  Food Insecurity: Not on file  Transportation Needs: Not on file  Physical Activity: Not on file  Stress: Not on file  Social Connections: Not on file  Intimate Partner Violence: Not on file    History reviewed. No pertinent family history.  No Known  Allergies  Outpatient Medications Prior to Visit  Medication Sig   atorvastatin (LIPITOR) 20 MG tablet TAKE 1 TABLET BY MOUTH AT BEDTIME FOR  HIGH  CHOLESTEROL  (NO  MORE  REFILLS  WITHOUT  APPOINTMENT)   FLUoxetine (PROZAC) 40 MG capsule Take 1 capsule by mouth once daily   phentermine (ADIPEX-P) 37.5 MG tablet Take 1 tablet (37.5 mg total) by mouth every morning.   No facility-administered medications prior to visit.    Review of Systems  Constitutional:  Positive for malaise/fatigue.       Weight gain  HENT: Negative.    Eyes: Negative.   Respiratory: Negative.  Negative for shortness of breath.   Cardiovascular: Negative.   Gastrointestinal: Negative.  Negative for constipation and diarrhea.  Genitourinary: Negative.   Musculoskeletal:  Positive for back pain. Negative for joint pain.  Skin: Negative.   Neurological:  Positive for tingling and headaches. Negative for dizziness.  Endo/Heme/Allergies: Negative.   All other systems reviewed and are negative.      Objective:   BP 110/74   Pulse (!) 105   Ht 5\' 2"  (1.575 m)   Wt 180 lb (81.6 kg)   SpO2 98%   BMI 32.92 kg/m   Vitals:   01/06/24 0947  BP: 110/74  Pulse: (!) 105  Height: 5\' 2"  (1.575 m)  Weight: 180 lb (81.6 kg)  SpO2: 98%  BMI (Calculated): 32.91    Physical Exam Vitals and nursing note reviewed.  Constitutional:      Appearance: Normal appearance. She is normal weight.  HENT:     Head: Normocephalic and atraumatic.     Nose: Nose normal.     Mouth/Throat:     Mouth: Mucous membranes are moist.  Eyes:     Extraocular Movements: Extraocular movements intact.     Conjunctiva/sclera: Conjunctivae normal.     Pupils: Pupils are equal, round, and reactive to light.  Cardiovascular:     Rate and Rhythm: Normal rate and regular rhythm.     Pulses: Normal pulses.     Heart sounds: Normal heart sounds.  Pulmonary:     Effort: Pulmonary effort is normal.     Breath sounds: Normal breath sounds.   Abdominal:     General: Abdomen is flat. Bowel sounds are normal.     Palpations: Abdomen is soft.  Musculoskeletal:        General: Normal range of motion.     Cervical back: Normal range of motion.  Skin:    General: Skin is warm and dry.  Neurological:     General: No focal deficit present.     Mental Status: She is alert and oriented to person, place, and time.  Psychiatric:        Mood and Affect: Mood normal.        Behavior: Behavior normal.        Thought Content: Thought content normal.        Judgment: Judgment normal.      No results found for any visits on 01/06/24.  Recent Results (from the past 2160 hours)  Hemoglobin A1c     Status: None   Collection Time: 01/05/24  8:46 AM  Result Value Ref Range   Hgb A1c MFr Bld 5.5 4.8 - 5.6 %    Comment:          Prediabetes: 5.7 - 6.4          Diabetes: >6.4          Glycemic control for adults with diabetes: <7.0    Est. average glucose Bld gHb Est-mCnc 111 mg/dL  TSH     Status: Abnormal   Collection Time: 01/05/24  8:46 AM  Result Value Ref Range   TSH 6.720 (H) 0.450 - 4.500 uIU/mL  CMP14+EGFR     Status: None   Collection Time: 01/05/24  8:46 AM  Result Value Ref Range   Glucose 88 70 - 99 mg/dL   BUN 10 6 - 24 mg/dL   Creatinine, Ser 1.61 0.57 - 1.00 mg/dL   eGFR 096 >04 VW/UJW/1.19   BUN/Creatinine Ratio 14 9 - 23   Sodium 139 134 - 144 mmol/L   Potassium 4.2 3.5 - 5.2 mmol/L   Chloride 103 96 - 106 mmol/L   CO2 21 20 - 29 mmol/L   Calcium 9.0 8.7 - 10.2 mg/dL   Total Protein 6.5 6.0 - 8.5 g/dL   Albumin 4.0 3.9 - 4.9 g/dL   Globulin, Total 2.5 1.5 - 4.5 g/dL   Bilirubin Total <1.4 0.0 - 1.2 mg/dL   Alkaline Phosphatase 88 44 - 121 IU/L   AST 19 0 - 40 IU/L   ALT 19 0 - 32 IU/L  Lipid panel     Status: Abnormal   Collection Time: 01/05/24  8:46 AM  Result Value Ref Range   Cholesterol, Total 194 100 - 199 mg/dL  Triglycerides 253 (H) 0 - 149 mg/dL   HDL 56 >40 mg/dL   VLDL Cholesterol Cal 42  (H) 5 - 40 mg/dL   LDL Chol Calc (NIH) 96 0 - 99 mg/dL   Chol/HDL Ratio 3.5 0.0 - 4.4 ratio    Comment:                                   T. Chol/HDL Ratio                                             Men  Women                               1/2 Avg.Risk  3.4    3.3                                   Avg.Risk  5.0    4.4                                2X Avg.Risk  9.6    7.1                                3X Avg.Risk 23.4   11.0       Assessment & Plan:  Restart phentermine. Will send in [place], insurance likely won't pay for it. Imitrex for headaches. Restart gabapentin. Recheck TSH in 1 month.  Problem List Items Addressed This Visit       Other   Mixed hyperlipidemia - Primary   Abnormal TSH   Relevant Orders   TSH   Weight loss counseling, encounter for    Return in about 3 months (around 04/07/2024).   Total time spent: 25 minutes  [employer], NP  01/06/2024   This document may have been prepared by Dragon Voice Recognition software and as such may include unintentional dictation errors.

## 2024-01-07 MED ORDER — PHENTERMINE HCL 37.5 MG PO TABS: 37.5000 mg | ORAL_TABLET | Freq: Every morning | ORAL | 2 refills | Status: DC

## 2024-01-09 ENCOUNTER — Other Ambulatory Visit: Payer: Self-pay | Admitting: Family

## 2024-01-25 ENCOUNTER — Other Ambulatory Visit

## 2024-01-25 DIAGNOSIS — R7989 Other specified abnormal findings of blood chemistry: Secondary | ICD-10-CM

## 2024-01-26 ENCOUNTER — Other Ambulatory Visit: Payer: Self-pay | Admitting: Cardiology

## 2024-01-26 LAB — TSH: TSH: 7.37 u[IU]/mL — ABNORMAL HIGH (ref 0.450–4.500)

## 2024-01-26 MED ORDER — LEVOTHYROXINE SODIUM 25 MCG PO TABS: 25.0000 ug | ORAL_TABLET | Freq: Every day | ORAL | 3 refills | Status: DC

## 2024-02-01 ENCOUNTER — Other Ambulatory Visit: Payer: Self-pay | Admitting: Family

## 2024-02-08 ENCOUNTER — Other Ambulatory Visit: Payer: Self-pay | Admitting: Family

## 2024-03-05 ENCOUNTER — Other Ambulatory Visit: Payer: Self-pay | Admitting: Cardiology

## 2024-03-07 ENCOUNTER — Other Ambulatory Visit: Payer: Self-pay | Admitting: Family

## 2024-04-09 ENCOUNTER — Other Ambulatory Visit: Payer: Self-pay | Admitting: Family

## 2024-04-10 ENCOUNTER — Other Ambulatory Visit

## 2024-04-10 ENCOUNTER — Other Ambulatory Visit: Payer: Self-pay | Admitting: Cardiology

## 2024-04-10 DIAGNOSIS — I10 Essential (primary) hypertension: Secondary | ICD-10-CM

## 2024-04-10 DIAGNOSIS — R7301 Impaired fasting glucose: Secondary | ICD-10-CM

## 2024-04-10 DIAGNOSIS — E669 Obesity, unspecified: Secondary | ICD-10-CM

## 2024-04-11 ENCOUNTER — Ambulatory Visit: Payer: Self-pay | Admitting: Cardiology

## 2024-04-11 ENCOUNTER — Ambulatory Visit: Admitting: Cardiology

## 2024-04-11 ENCOUNTER — Encounter: Payer: Self-pay | Admitting: Cardiology

## 2024-04-11 VITALS — BP 120/80 | HR 96 | Ht 62.0 in | Wt 176.4 lb

## 2024-04-11 DIAGNOSIS — E6609 Other obesity due to excess calories: Secondary | ICD-10-CM

## 2024-04-11 DIAGNOSIS — E66811 Obesity, class 1: Secondary | ICD-10-CM | POA: Diagnosis not present

## 2024-04-11 DIAGNOSIS — Z0130 Encounter for examination of blood pressure without abnormal findings: Secondary | ICD-10-CM

## 2024-04-11 DIAGNOSIS — E039 Hypothyroidism, unspecified: Secondary | ICD-10-CM | POA: Diagnosis not present

## 2024-04-11 DIAGNOSIS — E782 Mixed hyperlipidemia: Secondary | ICD-10-CM | POA: Diagnosis not present

## 2024-04-11 DIAGNOSIS — Z6832 Body mass index (BMI) 32.0-32.9, adult: Secondary | ICD-10-CM

## 2024-04-11 DIAGNOSIS — Z713 Dietary counseling and surveillance: Secondary | ICD-10-CM

## 2024-04-11 LAB — HEMOGLOBIN A1C
Est. average glucose Bld gHb Est-mCnc: 108 mg/dL
Hgb A1c MFr Bld: 5.4 % (ref 4.8–5.6)

## 2024-04-11 LAB — CMP14+EGFR
ALT: 25 IU/L (ref 0–32)
AST: 16 IU/L (ref 0–40)
Albumin: 4.2 g/dL (ref 3.9–4.9)
Alkaline Phosphatase: 100 IU/L (ref 44–121)
BUN/Creatinine Ratio: 12 (ref 9–23)
BUN: 10 mg/dL (ref 6–24)
Bilirubin Total: 0.2 mg/dL (ref 0.0–1.2)
CO2: 18 mmol/L — ABNORMAL LOW (ref 20–29)
Calcium: 8.9 mg/dL (ref 8.7–10.2)
Chloride: 99 mmol/L (ref 96–106)
Creatinine, Ser: 0.84 mg/dL (ref 0.57–1.00)
Globulin, Total: 2.4 g/dL (ref 1.5–4.5)
Glucose: 86 mg/dL (ref 70–99)
Potassium: 4.1 mmol/L (ref 3.5–5.2)
Sodium: 135 mmol/L (ref 134–144)
Total Protein: 6.6 g/dL (ref 6.0–8.5)
eGFR: 89 mL/min/{1.73_m2} (ref >59)

## 2024-04-11 LAB — LIPID PANEL
Chol/HDL Ratio: 3.3 ratio (ref 0.0–4.4)
Cholesterol, Total: 154 mg/dL (ref 100–199)
HDL: 46 mg/dL (ref >39)
LDL Chol Calc (NIH): 80 mg/dL (ref 0–99)
Triglycerides: 162 mg/dL — ABNORMAL HIGH (ref 0–149)
VLDL Cholesterol Cal: 28 mg/dL (ref 5–40)

## 2024-04-11 LAB — TSH: TSH: 3.33 u[IU]/mL (ref 0.450–4.500)

## 2024-04-11 MED ORDER — LEVOTHYROXINE SODIUM 50 MCG PO TABS: 50.0000 ug | ORAL_TABLET | Freq: Every day | ORAL | 5 refills | Status: DC

## 2024-04-11 NOTE — Progress Notes (Signed)
 Established Patient Office Visit  Subjective:  Patient ID: Lynn Flynn, female    DOB: November 03, 1980  Age: 43 y.o. MRN: [id number]  Chief Complaint  Patient presents with   Follow-up    3 month follow up    Patient in office for 3 month follow up, discuss recent lab work. Patient is doing well, no new complaints. Patient continues to complain of fatigue. Recently started on levothyroxine  for hypothyroidism. Recheck TSH was within range, will adjust levothyroxine  dose, alternate 25  and 50 mg.  Patient unable to get [facility] due to financial reasons. Patient continues take phentermine  with some success. Patient states she has made dietary changes and exercise for weight loss.     No other concerns at this time.   Past Medical History:  Diagnosis Date   Anxiety     History reviewed. No pertinent surgical history.  Social History   Socioeconomic History   Marital status: Married    Spouse name: Not on file   Number of children: Not on file   Years of education: Not on file   Highest education level: Not on file  Occupational History   Not on file  Tobacco Use   Smoking status: Never   Smokeless tobacco: Never  Substance and Sexual Activity   Alcohol use: Yes    Comment: social   Drug use: Not Currently   Sexual activity: Not on file  Other Topics Concern   Not on file  Social History Narrative   Not on file   Social Drivers of Health   Financial Resource Strain: Not on file  Food Insecurity: Not on file  Transportation Needs: Not on file  Physical Activity: Not on file  Stress: Not on file  Social Connections: Not on file  Intimate Partner Violence: Not on file    History reviewed. No pertinent family history.  No Known Allergies  Outpatient Medications Prior to Visit  Medication Sig   atorvastatin (LIPITOR) 20 MG tablet TAKE 1 TABLET BY MOUTH AT BEDTIME FOR  HIGH  CHOLESTEROL  (  NO  MORE  REFILLS  WITHOUT  APPOINTMENT)   FLUoxetine (PROZAC) 40 MG  capsule Take 1 capsule by mouth once daily   gabapentin  (NEURONTIN ) 100 MG capsule Take 1 capsule (100 mg total) by mouth at bedtime.   levothyroxine  (SYNTHROID ) 25 MCG tablet Take 1 tablet (25 mcg total) by mouth daily.   phentermine  (ADIPEX-P ) 37.5 MG tablet Take 1 tablet (37.5 mg total) by mouth every morning.   SUMAtriptan  (IMITREX ) 25 MG tablet Take 2 tablets (50 mg total) by mouth once as needed for migraine. May repeat in 2 hours if headache persists or recurs.   [DISCONTINUED] Semaglutide -Weight Management (WEGOVY ) 0.25 MG/0.5ML SOAJ Inject 0.25 mg into the skin once a week.   No facility-administered medications prior to visit.    Review of Systems  Constitutional: Negative.   HENT: Negative.    Eyes: Negative.   Respiratory: Negative.  Negative for shortness of breath.   Cardiovascular: Negative.  Negative for chest pain.  Gastrointestinal: Negative.  Negative for abdominal pain, constipation and diarrhea.  Genitourinary: Negative.   Musculoskeletal:  Negative for joint pain and myalgias.  Skin: Negative.   Neurological: Negative.  Negative for dizziness and headaches.  Endo/Heme/Allergies: Negative.   All other systems reviewed and are negative.      Objective:   BP 120/80   Pulse 96   Ht 5' 2 (1.575 m)   Wt 176 lb 6.4 oz (  80 kg)   SpO2 99%   BMI 32.26 kg/m   Vitals:   04/11/24 1311 04/11/24 1329  BP: 120/80   Pulse: (!) 103 96  Height: 5' 2 (1.575 m)   Weight: 176 lb 6.4 oz (80 kg)   SpO2: 99%   BMI (Calculated): 32.26     Physical Exam Vitals and nursing note reviewed.  Constitutional:      Appearance: Normal appearance. She is normal weight.  HENT:     Head: Normocephalic and atraumatic.     Nose: Nose normal.     Mouth/Throat:     Mouth: Mucous membranes are moist.   Eyes:     Extraocular Movements: Extraocular movements intact.     Conjunctiva/sclera: Conjunctivae normal.     Pupils: Pupils are equal, round, and reactive to light.     Cardiovascular:     Rate and Rhythm: Normal rate and regular rhythm.     Pulses: Normal pulses.     Heart sounds: Normal heart sounds.  Pulmonary:     Effort: Pulmonary effort is normal.     Breath sounds: Normal breath sounds.  Abdominal:     General: Abdomen is flat. Bowel sounds are normal.     Palpations: Abdomen is soft.   Musculoskeletal:        General: Normal range of motion.     Cervical back: Normal range of motion.   Skin:    General: Skin is warm and dry.   Neurological:     General: No focal deficit present.     Mental Status: She is alert and oriented to person, place, and time.   Psychiatric:        Mood and Affect: Mood normal.        Behavior: Behavior normal.        Thought Content: Thought content normal.        Judgment: Judgment normal.      No results found for any visits on 04/11/24.  Recent Results (from the past 2160 hours)  TSH     Status: Abnormal   Collection Time: 01/25/24 11:37 AM  Result Value Ref Range   TSH 7.370 (H) 0.450 - 4.500 uIU/mL  Lipid panel     Status: Abnormal   Collection Time: 04/10/24  9:03 AM  Result Value Ref Range   Cholesterol, Total 154 100 - 199 mg/dL   Triglycerides 837 (H) 0 - 149 mg/dL   HDL 46 >60 mg/dL   VLDL Cholesterol Cal 28 5 - 40 mg/dL   LDL Chol Calc (NIH) 80 0 - 99 mg/dL   Chol/HDL Ratio 3.3 0.0 - 4.4 ratio    Comment:                                   T. Chol/HDL Ratio                                             Men  Women                               1/2 Avg.Risk  3.4    3.3  Avg.Risk  5.0    4.4                                2X Avg.Risk  9.6    7.1                                3X Avg.Risk 23.4   11.0   CMP14+EGFR     Status: Abnormal   Collection Time: 04/10/24  9:03 AM  Result Value Ref Range   Glucose 86 70 - 99 mg/dL   BUN 10 6 - 24 mg/dL   Creatinine, Ser 9.15 0.57 - 1.00 mg/dL   eGFR 89 >40 fO/fpw/8.26   BUN/Creatinine Ratio 12 9 - 23    Sodium 135 134 - 144 mmol/L   Potassium 4.1 3.5 - 5.2 mmol/L   Chloride 99 96 - 106 mmol/L   CO2 18 (L) 20 - 29 mmol/L   Calcium 8.9 8.7 - 10.2 mg/dL   Total Protein 6.6 6.0 - 8.5 g/dL   Albumin 4.2 3.9 - 4.9 g/dL   Globulin, Total 2.4 1.5 - 4.5 g/dL   Bilirubin Total 0.2 0.0 - 1.2 mg/dL   Alkaline Phosphatase 100 44 - 121 IU/L   AST 16 0 - 40 IU/L   ALT 25 0 - 32 IU/L  TSH     Status: None   Collection Time: 04/10/24  9:03 AM  Result Value Ref Range   TSH 3.330 0.450 - 4.500 uIU/mL  Hemoglobin A1c     Status: None   Collection Time: 04/10/24  9:03 AM  Result Value Ref Range   Hgb A1c MFr Bld 5.4 4.8 - 5.6 %    Comment:          Prediabetes: 5.7 - 6.4          Diabetes: >6.4          Glycemic control for adults with diabetes: <7.0    Est. average glucose Bld gHb Est-mCnc 108 mg/dL      Assessment & Plan:  Alternate levothyroxine  25 mg and 50 mg  Problem List Items Addressed This Visit       Endocrine   Hypothyroidism - Primary   Relevant Medications   levothyroxine  (SYNTHROID ) 50 MCG tablet     Other   Mixed hyperlipidemia   Weight loss counseling, encounter for   Class 1 obesity due to excess calories with serious comorbidity and body mass index (BMI) of 32.0 to 32.9 in adult    Return in about 4 weeks (around 05/09/2024).   Total time spent: 25 minutes  Google, NP  04/11/2024   This document may have been prepared by Dragon Voice Recognition software and as such may include unintentional dictation errors.

## 2024-04-13 LAB — VITAMIN B12: Vitamin B-12: 344 pg/mL (ref 232–1245)

## 2024-04-13 LAB — VITAMIN D 25 HYDROXY (VIT D DEFICIENCY, FRACTURES): Vit D, 25-Hydroxy: 30.1 ng/mL (ref 30.0–100.0)

## 2024-04-13 LAB — SPECIMEN STATUS REPORT

## 2024-05-03 ENCOUNTER — Other Ambulatory Visit: Payer: Self-pay | Admitting: Family

## 2024-05-08 ENCOUNTER — Other Ambulatory Visit: Payer: Self-pay | Admitting: Cardiology

## 2024-05-13 ENCOUNTER — Other Ambulatory Visit: Payer: Self-pay | Admitting: Family

## 2024-05-16 ENCOUNTER — Ambulatory Visit: Admitting: Cardiology

## 2024-05-16 ENCOUNTER — Encounter: Payer: Self-pay | Admitting: Cardiology

## 2024-05-16 VITALS — BP 128/78 | HR 96 | Ht 62.0 in | Wt 178.6 lb

## 2024-05-16 DIAGNOSIS — R7989 Other specified abnormal findings of blood chemistry: Secondary | ICD-10-CM

## 2024-05-16 DIAGNOSIS — E039 Hypothyroidism, unspecified: Secondary | ICD-10-CM | POA: Diagnosis not present

## 2024-05-16 DIAGNOSIS — Z0130 Encounter for examination of blood pressure without abnormal findings: Secondary | ICD-10-CM

## 2024-05-16 NOTE — Progress Notes (Signed)
 Established Patient Office Visit  Subjective:  Patient ID: Lynn Flynn, female    DOB: [birth year]  Age: 43 y.o. MRN: [id number]  Chief Complaint  Patient presents with   Follow-up    4 week follow up     Patient in office for 4 week follow up. Patient doing well, no complaints today. Patient reports energy has been better since adjusting levothyroxine . Will check a TSH today. Discussed weight loss. Continue phentermine . Recently joined a gym, hoping to work out more.  Continue same medications.     No other concerns at this time.   Past Medical History:  Diagnosis Date   Anxiety     History reviewed. No pertinent surgical history.  Social History   Socioeconomic History   Marital status: Married    Spouse name: Not on file   Number of children: Not on file   Years of education: Not on file   Highest education level: Not on file  Occupational History   Not on file  Tobacco Use   Smoking status: Never   Smokeless tobacco: Never  Substance and Sexual Activity   Alcohol use: Yes    Comment: social   Drug use: Not Currently   Sexual activity: Not on file  Other Topics Concern   Not on file  Social History Narrative   Not on file   Social Drivers of Health   Financial Resource Strain: Not on file  Food Insecurity: Not on file  Transportation Needs: Not on file  Physical Activity: Not on file  Stress: Not on file  Social Connections: Not on file  Intimate Partner Violence: Not on file    History reviewed. No pertinent family history.  No Known Allergies  Outpatient Medications Prior to Visit  Medication Sig   atorvastatin (LIPITOR) 20 MG tablet TAKE 1 TABLET BY MOUTH AT BEDTIME FOR HIGH CHOLESTEROL (NO  MORE  REFILLS  WITHOUT  APPOINTMENT)   FLUoxetine (PROZAC) 40 MG capsule Take 1 capsule by mouth once daily   levothyroxine  (SYNTHROID ) 25 MCG tablet Take 1 tablet (25 mcg total) by mouth daily.   levothyroxine  (SYNTHROID ) 50 MCG tablet Take 1 tablet  (50 mcg total) by mouth daily before breakfast.   phentermine  (ADIPEX-P ) 37.5 MG tablet TAKE 1 TABLET BY MOUTH IN THE MORNING   SUMAtriptan  (IMITREX ) 25 MG tablet Take 2 tablets (50 mg total) by mouth once as needed for migraine. May repeat in 2 hours if headache persists or recurs.   gabapentin  (NEURONTIN ) 100 MG capsule Take 1 capsule (100 mg total) by mouth at bedtime. (Patient not taking: Reported on 05/16/2024)   No facility-administered medications prior to visit.    Review of Systems  Constitutional: Negative.   HENT: Negative.    Eyes: Negative.   Respiratory: Negative.  Negative for shortness of breath.   Cardiovascular: Negative.  Negative for chest pain.  Gastrointestinal: Negative.  Negative for abdominal pain, constipation and diarrhea.  Genitourinary: Negative.   Musculoskeletal:  Negative for joint pain and myalgias.  Skin: Negative.   Neurological: Negative.  Negative for dizziness and headaches.  Endo/Heme/Allergies: Negative.   All other systems reviewed and are negative.      Objective:   BP 128/78   Pulse 96   Ht 5' 2 (1.575 m)   Wt 178 lb 9.6 oz (81 kg)   SpO2 97%   BMI 32.67 kg/m   Vitals:   05/16/24 1105  BP: 128/78  Pulse: 96  Height: 5' 2 (1.575  m)  Weight: 178 lb 9.6 oz (81 kg)  SpO2: 97%  BMI (Calculated): 32.66    Physical Exam Vitals and nursing note reviewed.  Constitutional:      Appearance: Normal appearance. She is normal weight.  HENT:     Head: Normocephalic and atraumatic.     Nose: Nose normal.     Mouth/Throat:     Mouth: Mucous membranes are moist.  Eyes:     Extraocular Movements: Extraocular movements intact.     Conjunctiva/sclera: Conjunctivae normal.     Pupils: Pupils are equal, round, and reactive to light.  Cardiovascular:     Rate and Rhythm: Normal rate and regular rhythm.     Pulses: Normal pulses.     Heart sounds: Normal heart sounds.  Pulmonary:     Effort: Pulmonary effort is normal.     Breath  sounds: Normal breath sounds.  Abdominal:     General: Abdomen is flat. Bowel sounds are normal.     Palpations: Abdomen is soft.  Musculoskeletal:        General: Normal range of motion.     Cervical back: Normal range of motion.  Skin:    General: Skin is warm and dry.  Neurological:     General: No focal deficit present.     Mental Status: She is alert and oriented to person, place, and time.  Psychiatric:        Mood and Affect: Mood normal.        Behavior: Behavior normal.        Thought Content: Thought content normal.        Judgment: Judgment normal.      No results found for any visits on 05/16/24.  Recent Results (from the past 2160 hours)  Lipid panel     Status: Abnormal   Collection Time: 04/10/24  9:03 AM  Result Value Ref Range   Cholesterol, Total 154 100 - 199 mg/dL   Triglycerides 837 (H) 0 - 149 mg/dL   HDL 46 >60 mg/dL   VLDL Cholesterol Cal 28 5 - 40 mg/dL   LDL Chol Calc (NIH) 80 0 - 99 mg/dL   Chol/HDL Ratio 3.3 0.0 - 4.4 ratio    Comment:                                   T. Chol/HDL Ratio                                             Men  Women                               1/2 Avg.Risk  3.4    3.3                                   Avg.Risk  5.0    4.4                                2X Avg.Risk  9.6    7.1  3X Avg.Risk 23.4   11.0   CMP14+EGFR     Status: Abnormal   Collection Time: 04/10/24  9:03 AM  Result Value Ref Range   Glucose 86 70 - 99 mg/dL   BUN 10 6 - 24 mg/dL   Creatinine, Ser 9.15 0.57 - 1.00 mg/dL   eGFR 89 >40 fO/fpw/8.26   BUN/Creatinine Ratio 12 9 - 23   Sodium 135 134 - 144 mmol/L   Potassium 4.1 3.5 - 5.2 mmol/L   Chloride 99 96 - 106 mmol/L   CO2 18 (L) 20 - 29 mmol/L   Calcium 8.9 8.7 - 10.2 mg/dL   Total Protein 6.6 6.0 - 8.5 g/dL   Albumin 4.2 3.9 - 4.9 g/dL   Globulin, Total 2.4 1.5 - 4.5 g/dL   Bilirubin Total 0.2 0.0 - 1.2 mg/dL   Alkaline Phosphatase 100 44 - 121 IU/L   AST 16 0  - 40 IU/L   ALT 25 0 - 32 IU/L  TSH     Status: None   Collection Time: 04/10/24  9:03 AM  Result Value Ref Range   TSH 3.330 0.450 - 4.500 uIU/mL  Hemoglobin A1c     Status: None   Collection Time: 04/10/24  9:03 AM  Result Value Ref Range   Hgb A1c MFr Bld 5.4 4.8 - 5.6 %    Comment:          Prediabetes: 5.7 - 6.4          Diabetes: >6.4          Glycemic control for adults with diabetes: <7.0    Est. average glucose Bld gHb Est-mCnc 108 mg/dL  VITAMIN D  25 Hydroxy (Vit-D Deficiency, Fractures)     Status: None   Collection Time: 04/10/24  9:03 AM  Result Value Ref Range   Vit D, 25-Hydroxy 30.1 30.0 - 100.0 ng/mL    Comment: Vitamin D  deficiency has been defined by the Institute of Medicine and an Endocrine Society practice guideline as a level of serum 25-OH vitamin D  less than 20 ng/mL (1,2). The Endocrine Society went on to further define vitamin D  insufficiency as a level between 21 and 29 ng/mL (2). 1. IOM (Institute of Medicine). 2010. Dietary reference    intakes for calcium and D. [place]: The    [employer]. 2. Holick MF, Binkley Ault, Bischoff-Ferrari HA, et al.    Evaluation, treatment, and prevention of vitamin D     deficiency: an Endocrine Society clinical practice    guideline. JCEM. 2011 Jul; 96(7):1911-30.   Vitamin B12     Status: None   Collection Time: 04/10/24  9:03 AM  Result Value Ref Range   Vitamin B-12 344 232 - 1,245 pg/mL  Specimen status report     Status: None   Collection Time: 04/10/24  9:03 AM  Result Value Ref Range   specimen status report Comment     Comment: Written Authorization Written Authorization Written Authorization Received. Authorization received from Jalecia Leon [employer] 04-12-2024 Logged by Randie Ester       Assessment & Plan:  TSH level today Continue same medications  Problem List Items Addressed This Visit       Endocrine   Hypothyroidism - Primary   Relevant Orders   TSH     Other    Abnormal TSH   Relevant Orders   TSH    Return in about 2 months (around 07/17/2024) for fasting labs prior.   Total time spent: 25  minutes  Google, NP  05/16/2024   This document may have been prepared by Dragon Voice Recognition software and as such may include unintentional dictation errors.

## 2024-05-17 ENCOUNTER — Encounter: Payer: Self-pay | Admitting: Cardiology

## 2024-05-17 ENCOUNTER — Ambulatory Visit: Payer: Self-pay | Admitting: Cardiology

## 2024-05-17 LAB — TSH: TSH: 1.21 u[IU]/mL (ref 0.450–4.500)

## 2024-05-17 NOTE — Progress Notes (Signed)
Pt informed

## 2024-05-17 NOTE — Progress Notes (Signed)
 Left vm to return call.

## 2024-06-04 ENCOUNTER — Other Ambulatory Visit: Payer: Self-pay | Admitting: Cardiology

## 2024-06-12 ENCOUNTER — Other Ambulatory Visit: Payer: Self-pay | Admitting: Family

## 2024-06-27 ENCOUNTER — Other Ambulatory Visit: Payer: Self-pay | Admitting: Family

## 2024-07-02 ENCOUNTER — Other Ambulatory Visit: Payer: Self-pay | Admitting: Family

## 2024-07-02 ENCOUNTER — Other Ambulatory Visit: Payer: Self-pay | Admitting: Cardiology

## 2024-07-14 ENCOUNTER — Other Ambulatory Visit

## 2024-07-14 DIAGNOSIS — D519 Vitamin B12 deficiency anemia, unspecified: Secondary | ICD-10-CM

## 2024-07-14 DIAGNOSIS — E559 Vitamin D deficiency, unspecified: Secondary | ICD-10-CM

## 2024-07-14 DIAGNOSIS — E669 Obesity, unspecified: Secondary | ICD-10-CM

## 2024-07-14 DIAGNOSIS — R7301 Impaired fasting glucose: Secondary | ICD-10-CM

## 2024-07-14 DIAGNOSIS — I10 Essential (primary) hypertension: Secondary | ICD-10-CM

## 2024-07-15 LAB — CMP14+EGFR
ALT: 23 IU/L (ref 0–32)
AST: 18 IU/L (ref 0–40)
Albumin: 4.1 g/dL (ref 3.9–4.9)
Alkaline Phosphatase: 94 IU/L (ref 41–116)
BUN/Creatinine Ratio: 13 (ref 9–23)
BUN: 10 mg/dL (ref 6–24)
Bilirubin Total: 0.4 mg/dL (ref 0.0–1.2)
CO2: 20 mmol/L (ref 20–29)
Calcium: 9.1 mg/dL (ref 8.7–10.2)
Chloride: 99 mmol/L (ref 96–106)
Creatinine, Ser: 0.75 mg/dL (ref 0.57–1.00)
Globulin, Total: 2.6 g/dL (ref 1.5–4.5)
Glucose: 86 mg/dL (ref 70–99)
Potassium: 3.9 mmol/L (ref 3.5–5.2)
Sodium: 135 mmol/L (ref 134–144)
Total Protein: 6.7 g/dL (ref 6.0–8.5)
eGFR: 102 mL/min/1.73 (ref >59)

## 2024-07-15 LAB — LIPID PANEL
Chol/HDL Ratio: 3.2 ratio (ref 0.0–4.4)
Cholesterol, Total: 161 mg/dL (ref 100–199)
HDL: 51 mg/dL (ref >39)
LDL Chol Calc (NIH): 83 mg/dL (ref 0–99)
Triglycerides: 155 mg/dL — ABNORMAL HIGH (ref 0–149)
VLDL Cholesterol Cal: 27 mg/dL (ref 5–40)

## 2024-07-15 LAB — TSH: TSH: 2.94 u[IU]/mL (ref 0.450–4.500)

## 2024-07-15 LAB — VITAMIN D 25 HYDROXY (VIT D DEFICIENCY, FRACTURES): Vit D, 25-Hydroxy: 28.1 ng/mL — ABNORMAL LOW (ref 30.0–100.0)

## 2024-07-15 LAB — HEMOGLOBIN A1C
Est. average glucose Bld gHb Est-mCnc: 105 mg/dL
Hgb A1c MFr Bld: 5.3 % (ref 4.8–5.6)

## 2024-07-15 LAB — VITAMIN B12: Vitamin B-12: 339 pg/mL (ref 232–1245)

## 2024-07-17 ENCOUNTER — Ambulatory Visit: Payer: Self-pay | Admitting: Cardiology

## 2024-07-17 ENCOUNTER — Other Ambulatory Visit: Payer: Self-pay | Admitting: Cardiology

## 2024-07-18 ENCOUNTER — Encounter: Payer: Self-pay | Admitting: Cardiology

## 2024-07-18 ENCOUNTER — Ambulatory Visit: Admitting: Cardiology

## 2024-07-18 VITALS — BP 120/78 | HR 92 | Ht 62.0 in | Wt 177.0 lb

## 2024-07-18 DIAGNOSIS — Z713 Dietary counseling and surveillance: Secondary | ICD-10-CM

## 2024-07-18 DIAGNOSIS — E039 Hypothyroidism, unspecified: Secondary | ICD-10-CM

## 2024-07-18 DIAGNOSIS — E782 Mixed hyperlipidemia: Secondary | ICD-10-CM

## 2024-07-18 DIAGNOSIS — Z1211 Encounter for screening for malignant neoplasm of colon: Secondary | ICD-10-CM

## 2024-07-18 DIAGNOSIS — E66811 Obesity, class 1: Secondary | ICD-10-CM | POA: Diagnosis not present

## 2024-07-18 DIAGNOSIS — E6609 Other obesity due to excess calories: Secondary | ICD-10-CM

## 2024-07-18 DIAGNOSIS — Z6832 Body mass index (BMI) 32.0-32.9, adult: Secondary | ICD-10-CM

## 2024-07-18 MED ORDER — WEGOVY 0.25 MG/0.5ML ~~LOC~~ SOAJ: 0.2500 mg | SUBCUTANEOUS | 3 refills | Status: DC

## 2024-07-18 NOTE — Patient Instructions (Signed)
 [facility] [address], [address] [place],  [place]  [address]  Main: [phone]

## 2024-07-18 NOTE — Progress Notes (Signed)
 Established Patient Office Visit  Subjective:  Patient ID: Lynn Flynn, female    DOB: 1981/06/05  Age: 43 y.o. MRN: [id number]  Chief Complaint  Patient presents with   Follow-up    2 Months Follow Up    Patient in office for 2 month follow up, discuss recent lab work. Pateint doing well overall, complains of episodes of exhaustion. Discussed recent lab work. Vitamin D  slightly decreased, recommend OTC vitamin D  supplement.  Patient wanting to try Wegovy , insurance will not approve, patient wanting to possibly pay out of pocket.  Due for mammogram, order sent. Due for pap smear, will schedule to be done at follow up.     No other concerns at this time.   Past Medical History:  Diagnosis Date   Anxiety     History reviewed. No pertinent surgical history.  Social History   Socioeconomic History   Marital status: Married    Spouse name: Not on file   Number of children: Not on file   Years of education: Not on file   Highest education level: Not on file  Occupational History   Not on file  Tobacco Use   Smoking status: Never   Smokeless tobacco: Never  Substance and Sexual Activity   Alcohol use: Yes    Comment: social   Drug use: Not Currently   Sexual activity: Not on file  Other Topics Concern   Not on file  Social History Narrative   Not on file   Social Drivers of Health   Financial Resource Strain: Not on file  Food Insecurity: Not on file  Transportation Needs: Not on file  Physical Activity: Not on file  Stress: Not on file  Social Connections: Not on file  Intimate Partner Violence: Not on file    History reviewed. No pertinent family history.  No Known Allergies  Outpatient Medications Prior to Visit  Medication Sig   atorvastatin (LIPITOR) 20 MG tablet TAKE 1 TABLET BY MOUTH AT BEDTIME FOR  HIGH  CHOLESTEROL  NO  MORE  REFILLS  WITHOUT  APPOINTMENT   FLUoxetine (PROZAC) 40 MG capsule Take 1 capsule by mouth once daily   gabapentin   (NEURONTIN ) 100 MG capsule Take 1 capsule (100 mg total) by mouth at bedtime.   levothyroxine  (SYNTHROID ) 25 MCG tablet Take 1 tablet by mouth once daily   levothyroxine  (SYNTHROID ) 50 MCG tablet Take 1 tablet (50 mcg total) by mouth daily before breakfast.   phentermine  (ADIPEX-P ) 37.5 MG tablet TAKE 1 TABLET BY MOUTH IN THE MORNING   SUMAtriptan  (IMITREX ) 25 MG tablet Take 2 tablets (50 mg total) by mouth once as needed for migraine. May repeat in 2 hours if headache persists or recurs.   No facility-administered medications prior to visit.    Review of Systems  Constitutional: Negative.   HENT: Negative.    Eyes: Negative.   Respiratory: Negative.  Negative for shortness of breath.   Cardiovascular: Negative.  Negative for chest pain.  Gastrointestinal: Negative.  Negative for abdominal pain, constipation and diarrhea.  Genitourinary: Negative.   Musculoskeletal:  Negative for joint pain and myalgias.  Skin: Negative.   Neurological: Negative.  Negative for dizziness and headaches.  Endo/Heme/Allergies: Negative.   All other systems reviewed and are negative.      Objective:   BP 120/78   Pulse 92   Ht 5' 2 (1.575 m)   Wt 177 lb (80.3 kg)   SpO2 99%   BMI 32.37 kg/m  Vitals:   07/18/24 1023  BP: 120/78  Pulse: 92  Height: 5' 2 (1.575 m)  Weight: 177 lb (80.3 kg)  SpO2: 99%  BMI (Calculated): 32.37    Physical Exam Vitals and nursing note reviewed.  Constitutional:      Appearance: Normal appearance. She is normal weight.  HENT:     Head: Normocephalic and atraumatic.     Nose: Nose normal.     Mouth/Throat:     Mouth: Mucous membranes are moist.  Eyes:     Extraocular Movements: Extraocular movements intact.     Conjunctiva/sclera: Conjunctivae normal.     Pupils: Pupils are equal, round, and reactive to light.  Cardiovascular:     Rate and Rhythm: Normal rate and regular rhythm.     Pulses: Normal pulses.     Heart sounds: Normal heart sounds.   Pulmonary:     Effort: Pulmonary effort is normal.     Breath sounds: Normal breath sounds.  Abdominal:     General: Abdomen is flat. Bowel sounds are normal.     Palpations: Abdomen is soft.  Musculoskeletal:        General: Normal range of motion.     Cervical back: Normal range of motion.  Skin:    General: Skin is warm and dry.  Neurological:     General: No focal deficit present.     Mental Status: She is alert and oriented to person, place, and time.  Psychiatric:        Mood and Affect: Mood normal.        Behavior: Behavior normal.        Thought Content: Thought content normal.        Judgment: Judgment normal.      No results found for any visits on 07/18/24.  Recent Results (from the past 2160 hours)  TSH     Status: None   Collection Time: 05/16/24 11:50 AM  Result Value Ref Range   TSH 1.210 0.450 - 4.500 uIU/mL  Vitamin B12     Status: None   Collection Time: 07/14/24  9:11 AM  Result Value Ref Range   Vitamin B-12 339 232 - 1,245 pg/mL  VITAMIN D  25 Hydroxy (Vit-D Deficiency, Fractures)     Status: Abnormal   Collection Time: 07/14/24  9:11 AM  Result Value Ref Range   Vit D, 25-Hydroxy 28.1 (L) 30.0 - 100.0 ng/mL    Comment: Vitamin D  deficiency has been defined by the Institute of Medicine and an Endocrine Society practice guideline as a level of serum 25-OH vitamin D  less than 20 ng/mL (1,2). The Endocrine Society went on to further define vitamin D  insufficiency as a level between 21 and 29 ng/mL (2). 1. IOM (Institute of Medicine). 2010. Dietary reference    intakes for calcium and D. [place]: The    [employer]. 2. Holick MF, Binkley Humble, Bischoff-Ferrari HA, et al.    Evaluation, treatment, and prevention of vitamin D     deficiency: an Endocrine Society clinical practice    guideline. JCEM. 2011 Jul; 96(7):1911-30.   Hemoglobin A1c     Status: None   Collection Time: 07/14/24  9:11 AM  Result Value Ref Range   Hgb A1c MFr  Bld 5.3 4.8 - 5.6 %    Comment:          Prediabetes: 5.7 - 6.4          Diabetes: >6.4  Glycemic control for adults with diabetes: <7.0    Est. average glucose Bld gHb Est-mCnc 105 mg/dL  TSH     Status: None   Collection Time: 07/14/24  9:11 AM  Result Value Ref Range   TSH 2.940 0.450 - 4.500 uIU/mL  CMP14+EGFR     Status: None   Collection Time: 07/14/24  9:11 AM  Result Value Ref Range   Glucose 86 70 - 99 mg/dL   BUN 10 6 - 24 mg/dL   Creatinine, Ser 9.24 0.57 - 1.00 mg/dL   eGFR 897 >40 fO/fpw/8.26   BUN/Creatinine Ratio 13 9 - 23   Sodium 135 134 - 144 mmol/L   Potassium 3.9 3.5 - 5.2 mmol/L   Chloride 99 96 - 106 mmol/L   CO2 20 20 - 29 mmol/L   Calcium 9.1 8.7 - 10.2 mg/dL   Total Protein 6.7 6.0 - 8.5 g/dL   Albumin 4.1 3.9 - 4.9 g/dL   Globulin, Total 2.6 1.5 - 4.5 g/dL   Bilirubin Total 0.4 0.0 - 1.2 mg/dL   Alkaline Phosphatase 94 41 - 116 IU/L    Comment:               **Please note reference interval change**   AST 18 0 - 40 IU/L   ALT 23 0 - 32 IU/L  Lipid panel     Status: Abnormal   Collection Time: 07/14/24  9:11 AM  Result Value Ref Range   Cholesterol, Total 161 100 - 199 mg/dL   Triglycerides 844 (H) 0 - 149 mg/dL   HDL 51 >60 mg/dL   VLDL Cholesterol Cal 27 5 - 40 mg/dL   LDL Chol Calc (NIH) 83 0 - 99 mg/dL   Chol/HDL Ratio 3.2 0.0 - 4.4 ratio    Comment:                                   T. Chol/HDL Ratio                                             Men  Women                               1/2 Avg.Risk  3.4    3.3                                   Avg.Risk  5.0    4.4                                2X Avg.Risk  9.6    7.1                                3X Avg.Risk 23.4   11.0       Assessment & Plan:  OTC vitamin D  supplement Wegovy  Mammogram ordered Schedule pap smear  Problem List Items Addressed This Visit       Endocrine   Hypothyroidism - Primary     Other   Mixed hyperlipidemia   Weight  loss counseling, encounter  for   Relevant Medications   semaglutide -weight management (WEGOVY ) 0.25 MG/0.5ML SOAJ SQ injection   Class 1 obesity due to excess calories with serious comorbidity and body mass index (BMI) of 32.0 to 32.9 in adult   Relevant Medications   semaglutide -weight management (WEGOVY ) 0.25 MG/0.5ML SOAJ SQ injection   Other Visit Diagnoses       Colon cancer screening       Relevant Orders   MM 3D SCREENING MAMMOGRAM BILATERAL BREAST       Return in about 4 months (around 11/17/2024) for fasting lab work prior, with NK for pap.   Total time spent: 25 minutes  Google, NP  07/18/2024   This document may have been prepared by Dragon Voice Recognition software and as such may include unintentional dictation errors.

## 2024-07-28 ENCOUNTER — Other Ambulatory Visit: Payer: Self-pay

## 2024-07-28 MED ORDER — ATORVASTATIN CALCIUM 20 MG PO TABS: ORAL_TABLET | ORAL | 0 refills | Status: DC

## 2024-07-28 MED ORDER — FLUOXETINE HCL 40 MG PO CAPS: 40.0000 mg | ORAL_CAPSULE | Freq: Every day | ORAL | 0 refills | Status: DC

## 2024-07-28 MED ORDER — LEVOTHYROXINE SODIUM 25 MCG PO TABS: 25.0000 ug | ORAL_TABLET | Freq: Every day | ORAL | 1 refills | Status: DC

## 2024-07-28 MED ORDER — LEVOTHYROXINE SODIUM 50 MCG PO TABS: 50.0000 ug | ORAL_TABLET | Freq: Every day | ORAL | 5 refills | Status: DC

## 2024-07-30 ENCOUNTER — Other Ambulatory Visit: Payer: Self-pay | Admitting: Family

## 2024-08-03 ENCOUNTER — Other Ambulatory Visit: Payer: Self-pay | Admitting: Family

## 2024-08-03 ENCOUNTER — Other Ambulatory Visit: Payer: Self-pay | Admitting: Internal Medicine

## 2024-09-05 ENCOUNTER — Other Ambulatory Visit: Payer: Self-pay | Admitting: Family

## 2024-09-21 ENCOUNTER — Other Ambulatory Visit: Payer: Self-pay | Admitting: Internal Medicine

## 2024-10-06 ENCOUNTER — Other Ambulatory Visit: Payer: Self-pay | Admitting: Family

## 2024-10-09 ENCOUNTER — Other Ambulatory Visit: Payer: Self-pay | Admitting: Internal Medicine

## 2024-10-09 ENCOUNTER — Other Ambulatory Visit: Payer: Self-pay | Admitting: Cardiology

## 2024-10-20 NOTE — Telephone Encounter (Signed)
 Left a VM for pt. To return call

## 2024-10-24 NOTE — Telephone Encounter (Signed)
 Pt. Has been scheduled for 1/13 to document weight mangement.

## 2024-10-31 ENCOUNTER — Encounter: Payer: Self-pay | Admitting: Cardiology

## 2024-10-31 ENCOUNTER — Ambulatory Visit: Admitting: Cardiology

## 2024-10-31 VITALS — BP 118/58 | HR 98 | Ht 62.0 in | Wt 179.0 lb

## 2024-10-31 DIAGNOSIS — E782 Mixed hyperlipidemia: Secondary | ICD-10-CM

## 2024-10-31 DIAGNOSIS — E66811 Obesity, class 1: Secondary | ICD-10-CM

## 2024-10-31 DIAGNOSIS — E039 Hypothyroidism, unspecified: Secondary | ICD-10-CM

## 2024-10-31 DIAGNOSIS — E6609 Other obesity due to excess calories: Secondary | ICD-10-CM

## 2024-10-31 DIAGNOSIS — Z131 Encounter for screening for diabetes mellitus: Secondary | ICD-10-CM

## 2024-10-31 DIAGNOSIS — Z713 Dietary counseling and surveillance: Secondary | ICD-10-CM

## 2024-10-31 DIAGNOSIS — Z6832 Body mass index (BMI) 32.0-32.9, adult: Secondary | ICD-10-CM | POA: Diagnosis not present

## 2024-10-31 NOTE — Progress Notes (Signed)
 "  Established Patient Office Visit  Subjective:  Patient ID: Lynn Flynn, female    DOB: [birth year]  Age: 44 y.o. MRN: [id number]  Chief Complaint  Patient presents with   Follow-up    Weight Management     Patient in office for follow up for documentation of weight for weight loss. Patient taking phentermine  daily for the past 6 weeks. Patient weight unchanged. Patient prefers to no longer take phentermine  as it is not helping her lose weight. Due for fasting lab work, will do today. Has not scheduled mammogram, phone number given to patient to call to schedule.  Schedule pap smear at next visit.  Continue current medications.     No other concerns at this time.   Past Medical History:  Diagnosis Date   Anxiety     History reviewed. No pertinent surgical history.  Social History   Socioeconomic History   Marital status: Married    Spouse name: Not on file   Number of children: Not on file   Years of education: Not on file   Highest education level: Not on file  Occupational History   Not on file  Tobacco Use   Smoking status: Never   Smokeless tobacco: Never  Substance and Sexual Activity   Alcohol use: Yes    Comment: social   Drug use: Not Currently   Sexual activity: Not on file  Other Topics Concern   Not on file  Social History Narrative   Not on file   Social Drivers of Health   Tobacco Use: Low Risk (10/31/2024)   Patient History    Smoking Tobacco Use: Never    Smokeless Tobacco Use: Never    Passive Exposure: Not on file  Financial Resource Strain: Not on file  Food Insecurity: Not on file  Transportation Needs: Not on file  Physical Activity: Not on file  Stress: Not on file  Social Connections: Not on file  Intimate Partner Violence: Not on file  Depression (EYV7-0): Not on file  Alcohol Screen: Not on file  Housing: Not on file  Utilities: Not on file  Health Literacy: Not on file    History reviewed. No pertinent family  history.  Allergies[1]  Show/hide medication list[2]  Review of Systems  Constitutional: Negative.   HENT: Negative.    Eyes: Negative.   Respiratory: Negative.  Negative for shortness of breath.   Cardiovascular: Negative.  Negative for chest pain.  Gastrointestinal: Negative.  Negative for abdominal pain, constipation and diarrhea.  Genitourinary: Negative.   Musculoskeletal:  Negative for joint pain and myalgias.  Skin: Negative.   Neurological: Negative.  Negative for dizziness and headaches.  Endo/Heme/Allergies: Negative.   All other systems reviewed and are negative.      Objective:   BP (!) 118/58   Pulse 98   Ht 5' 2 (1.575 m)   Wt 179 lb (81.2 kg)   SpO2 98%   BMI 32.74 kg/m   Vitals:   10/31/24 0945  BP: (!) 118/58  Pulse: 98  Height: 5' 2 (1.575 m)  Weight: 179 lb (81.2 kg)  SpO2: 98%  BMI (Calculated): 32.73    Physical Exam Vitals and nursing note reviewed.  Constitutional:      Appearance: Normal appearance. She is normal weight.  HENT:     Head: Normocephalic and atraumatic.     Nose: Nose normal.     Mouth/Throat:     Mouth: Mucous membranes are moist.  Eyes:  Extraocular Movements: Extraocular movements intact.     Conjunctiva/sclera: Conjunctivae normal.     Pupils: Pupils are equal, round, and reactive to light.  Cardiovascular:     Rate and Rhythm: Normal rate and regular rhythm.     Pulses: Normal pulses.     Heart sounds: Normal heart sounds.  Pulmonary:     Effort: Pulmonary effort is normal.     Breath sounds: Normal breath sounds.  Abdominal:     General: Abdomen is flat. Bowel sounds are normal.     Palpations: Abdomen is soft.  Musculoskeletal:        General: Normal range of motion.     Cervical back: Normal range of motion.  Skin:    General: Skin is warm and dry.  Neurological:     General: No focal deficit present.     Mental Status: She is alert and oriented to person, place, and time.  Psychiatric:         Mood and Affect: Mood normal.        Behavior: Behavior normal.        Thought Content: Thought content normal.        Judgment: Judgment normal.      No results found for any visits on 10/31/24.  No results found for this or any previous visit (from the past 2160 hours).    Assessment & Plan:  Lab work today Schedule mammogram Schedule pap smear Continue current medications  Problem List Items Addressed This Visit       Endocrine   Hypothyroidism - Primary   Relevant Orders   TSH     Other   Mixed hyperlipidemia   Relevant Orders   Lipid Profile   Weight loss counseling, encounter for   Relevant Orders   CMP14+EGFR   Class 1 obesity due to excess calories with serious comorbidity and body mass index (BMI) of 32.0 to 32.9 in adult   Relevant Orders   CMP14+EGFR   Other Visit Diagnoses       Diabetes mellitus screening       Relevant Orders   Hemoglobin A1c   CMP14+EGFR       Return in about 4 months (around 02/28/2025) for with pap smear, fasting lab work prior.   Total time spent: 25 minutes. This time includes review of previous notes and results and patient face to face interaction during today's visit.    Jeoffrey Pollen, NP  10/31/2024   This document may have been prepared by [facility] Voice Recognition software and as such may include unintentional dictation errors.     [1] No Known Allergies [2]  Outpatient Medications Prior to Visit  Medication Sig   atorvastatin  (LIPITOR) 20 MG tablet TAKE 1 TABLET BY MOUTH AT BEDTIME FOR  HIGH  CHOLESTEROL  (NO  MORE  REFILLS  WITHOUT  APPOINTMENT)   FLUoxetine  (PROZAC ) 40 MG capsule Take 1 capsule by mouth once daily   levothyroxine  (SYNTHROID ) 50 MCG tablet TAKE 1 TABLET BY MOUTH ONCE DAILY BEFORE BREAKFAST ALTERNATE  WITH  25  MG   [DISCONTINUED] phentermine  (ADIPEX-P ) 37.5 MG tablet TAKE 1 TABLET BY MOUTH IN THE MORNING   [DISCONTINUED] gabapentin  (NEURONTIN ) 100 MG capsule Take 1 capsule (100 mg total)  by mouth at bedtime.   [DISCONTINUED] levothyroxine  (SYNTHROID ) 25 MCG tablet Take 1 tablet (25 mcg total) by mouth daily. (Patient not taking: Reported on 10/31/2024)   [DISCONTINUED] semaglutide -weight management (WEGOVY ) 0.25 MG/0.5ML SOAJ SQ injection Inject 0.25 mg into  the skin once a week.   [DISCONTINUED] SUMAtriptan  (IMITREX ) 25 MG tablet Take 2 tablets (50 mg total) by mouth once as needed for migraine. May repeat in 2 hours if headache persists or recurs.   No facility-administered medications prior to visit.   "

## 2024-10-31 NOTE — Patient Instructions (Signed)
 [facility] [address], [address] [place],  [place]  [address]  Main: [phone]

## 2024-11-01 ENCOUNTER — Ambulatory Visit: Payer: Self-pay | Admitting: Cardiology

## 2024-11-01 LAB — CMP14+EGFR
ALT: 26 IU/L (ref 0–32)
AST: 14 IU/L (ref 0–40)
Albumin: 4.3 g/dL (ref 3.9–4.9)
Alkaline Phosphatase: 105 IU/L (ref 41–116)
BUN/Creatinine Ratio: 14 (ref 9–23)
BUN: 11 mg/dL (ref 6–24)
Bilirubin Total: 0.4 mg/dL (ref 0.0–1.2)
CO2: 22 mmol/L (ref 20–29)
Calcium: 9 mg/dL (ref 8.7–10.2)
Chloride: 104 mmol/L (ref 96–106)
Creatinine, Ser: 0.76 mg/dL (ref 0.57–1.00)
Globulin, Total: 2.4 g/dL (ref 1.5–4.5)
Glucose: 93 mg/dL (ref 70–99)
Potassium: 3.9 mmol/L (ref 3.5–5.2)
Sodium: 139 mmol/L (ref 134–144)
Total Protein: 6.7 g/dL (ref 6.0–8.5)
eGFR: 100 mL/min/1.73

## 2024-11-01 LAB — HEMOGLOBIN A1C
Est. average glucose Bld gHb Est-mCnc: 108 mg/dL
Hgb A1c MFr Bld: 5.4 % (ref 4.8–5.6)

## 2024-11-01 LAB — LIPID PANEL
Chol/HDL Ratio: 3.4 ratio (ref 0.0–4.4)
Cholesterol, Total: 152 mg/dL (ref 100–199)
HDL: 45 mg/dL
LDL Chol Calc (NIH): 85 mg/dL (ref 0–99)
Triglycerides: 125 mg/dL (ref 0–149)
VLDL Cholesterol Cal: 22 mg/dL (ref 5–40)

## 2024-11-01 LAB — TSH: TSH: 1.56 u[IU]/mL (ref 0.450–4.500)

## 2024-11-14 ENCOUNTER — Encounter: Admitting: Internal Medicine

## 2025-03-01 ENCOUNTER — Encounter: Admitting: Internal Medicine
